# Patient Record
Sex: Female | Born: 1965 | Race: Black or African American | Hispanic: No | Marital: Married | Smoking: Never smoker
Health system: Southern US, Community
[De-identification: ages and names within clinical notes are randomized; demographics above are authoritative.]

## PROBLEM LIST (undated history)

## (undated) DIAGNOSIS — Z803 Family history of malignant neoplasm of breast: Secondary | ICD-10-CM

## (undated) DIAGNOSIS — Z8619 Personal history of other infectious and parasitic diseases: Secondary | ICD-10-CM

## (undated) DIAGNOSIS — R921 Mammographic calcification found on diagnostic imaging of breast: Secondary | ICD-10-CM

## (undated) DIAGNOSIS — D649 Anemia, unspecified: Secondary | ICD-10-CM

## (undated) HISTORY — DX: Family history of malignant neoplasm of breast: Z80.3

## (undated) HISTORY — PX: WISDOM TOOTH EXTRACTION: SHX21

## (undated) HISTORY — DX: Anemia, unspecified: D64.9

## (undated) HISTORY — PX: COLON SURGERY: SHX602

## (undated) HISTORY — DX: Personal history of other infectious and parasitic diseases: Z86.19

## (undated) HISTORY — PX: APPENDECTOMY: SHX54

---
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
## 2005-03-11 ENCOUNTER — Ambulatory Visit: Payer: Self-pay | Admitting: General Practice

## 2005-04-01 ENCOUNTER — Ambulatory Visit: Payer: Self-pay | Admitting: Unknown Physician Specialty

## 2005-04-21 HISTORY — PX: LAPAROSCOPIC SUPRACERVICAL HYSTERECTOMY: SUR797

## 2005-04-21 HISTORY — PX: ABDOMINAL HYSTERECTOMY: SHX81

## 2005-09-29 ENCOUNTER — Ambulatory Visit: Payer: Self-pay | Admitting: General Surgery

## 2006-01-19 ENCOUNTER — Ambulatory Visit: Payer: Self-pay | Admitting: General Surgery

## 2006-03-27 ENCOUNTER — Ambulatory Visit: Payer: Self-pay | Admitting: General Surgery

## 2006-03-28 ENCOUNTER — Ambulatory Visit: Payer: Self-pay | Admitting: General Surgery

## 2006-03-29 ENCOUNTER — Ambulatory Visit: Payer: Self-pay | Admitting: General Surgery

## 2006-03-30 ENCOUNTER — Ambulatory Visit: Payer: Self-pay | Admitting: General Surgery

## 2006-03-31 ENCOUNTER — Ambulatory Visit: Payer: Self-pay | Admitting: General Surgery

## 2006-04-22 ENCOUNTER — Inpatient Hospital Stay: Payer: Self-pay | Admitting: General Surgery

## 2006-07-16 ENCOUNTER — Ambulatory Visit: Payer: Self-pay | Admitting: Orthopedic Surgery

## 2006-07-24 ENCOUNTER — Ambulatory Visit: Payer: Self-pay | Admitting: Orthopedic Surgery

## 2011-11-12 ENCOUNTER — Ambulatory Visit (INDEPENDENT_AMBULATORY_CARE_PROVIDER_SITE_OTHER): Payer: BC Managed Care – PPO | Admitting: Obstetrics and Gynecology

## 2011-11-12 ENCOUNTER — Telehealth: Payer: Self-pay | Admitting: Obstetrics and Gynecology

## 2011-11-12 ENCOUNTER — Encounter: Payer: Self-pay | Admitting: Obstetrics and Gynecology

## 2011-11-12 VITALS — BP 114/72 | HR 70 | Ht 63.75 in | Wt 147.5 lb

## 2011-11-12 DIAGNOSIS — R928 Other abnormal and inconclusive findings on diagnostic imaging of breast: Secondary | ICD-10-CM

## 2011-11-12 DIAGNOSIS — N63 Unspecified lump in unspecified breast: Secondary | ICD-10-CM

## 2011-11-12 DIAGNOSIS — Z124 Encounter for screening for malignant neoplasm of cervix: Secondary | ICD-10-CM

## 2011-11-12 DIAGNOSIS — Z01419 Encounter for gynecological examination (general) (routine) without abnormal findings: Secondary | ICD-10-CM

## 2011-11-12 NOTE — Progress Notes (Signed)
Subjective:    Teresa Potter is a 46 y.o. female, G3P0, who presents for an annual exam. The patient reports "knot" at the top of left breast for the past 2 weeks. States the area is tender but less so today.  Menstrual cycle:   LMP: No LMP recorded. Patient has had a hysterectomy.             Review of Systems Pertinent items are noted in HPI. Denies pelvic pain, urinary tract symptoms, vaginitis symptoms, irregular bleeding, menopausal symptoms, change in bowel habits or rectal bleeding   Objective:    Ht 5' 3.75" (1.619 m)  Wt 147 lb 8 oz (66.906 kg)  BMI 25.52 kg/m2 Wt Readings from Last 1 Encounters:  11/12/11 147 lb 8 oz (66.906 kg)   Body mass index is 25.52 kg/(m^2). General Appearance: Alert, no acute distress HEENT: Grossly normal Neck / Thyroid: Supple, no thyromegaly or cervical adenopathy Lungs: Clear to auscultation bilaterally Back: No CVA tenderness Breast Exam: Right breast normal, left breast-upper outer quadrant, palpable area of thickening that is mildly tender measuring 4 cm x  6 cm, .No dimpling, nipple retraction or discharge. Cardiovascular: Regular rate and rhythm.  Gastrointestinal: Soft, non-tender, no masses or organomegaly Pelvic Exam: EGBUS-wnl, vagina-normal rugae, cervix- without lesions or tenderness, uterus surgically absent,  adnexae-no masses or tenderness Rectovaginal: small hemorrhoid and normal sphincter tone Lymphatic Exam: Non-palpable nodes in neck, clavicular,  axillary, or inguinal regions  Skin: no rashes or abnormalities Extremities: no clubbing cyanosis or edema  Neurologic: grossly normal Psychiatric: Alert and oriented   Assessment:   Routine GYN Exam S/P Total Abdominal Supra-cervical Hysterectomy Left Breast thickening-tender   Plan:  PAP sent  [facility] for diagnostic left breast mammogram with ultrasound if needed for a left upper outer quadrant thickening with tenderness.  Decrease caffeine   intake  RTO 1 year or prn  Torina Ey,ELMIRAPA-C

## 2011-11-12 NOTE — Progress Notes (Signed)
Regular Periods: no Mammogram: yes  Monthly Breast Ex.: yes Exercise: yes  Tetanus < 10 years: no Seatbelts: yes  NI. Bladder Functn.: yes Abuse at home: no  Daily BM's: no Stressful Work: no  Healthy Diet: yes Sigmoid-Colonoscopy: OVER 5 YEARS  Calcium: no Medical problems this year: BREAST PROBLEM   LAST PAP:4/11 NL  Contraception: BTL AND HYST  Mammogram:  4/13  PCP: NO  PMH: NO CHANGE  FMH: NO CHANGE  Last Bone Scan: NO

## 2011-11-12 NOTE — Addendum Note (Signed)
Addended byWinfred Leeds on: 11/12/2011 04:11 PM   Modules accepted: Orders

## 2011-11-13 LAB — PAP IG W/ RFLX HPV ASCU

## 2013-01-20 ENCOUNTER — Other Ambulatory Visit (HOSPITAL_BASED_OUTPATIENT_CLINIC_OR_DEPARTMENT_OTHER): Payer: Self-pay | Admitting: *Deleted

## 2013-01-20 DIAGNOSIS — Z1231 Encounter for screening mammogram for malignant neoplasm of breast: Secondary | ICD-10-CM

## 2013-01-21 ENCOUNTER — Other Ambulatory Visit: Payer: Self-pay | Admitting: Obstetrics and Gynecology

## 2013-01-21 ENCOUNTER — Ambulatory Visit (HOSPITAL_BASED_OUTPATIENT_CLINIC_OR_DEPARTMENT_OTHER)
Admission: RE | Admit: 2013-01-21 | Discharge: 2013-01-21 | Disposition: A | Discharge status: Home / Self Care | Payer: 59 | Source: Ambulatory Visit | Attending: Unknown Physician Specialty | Admitting: Unknown Physician Specialty

## 2013-01-21 DIAGNOSIS — Z1231 Encounter for screening mammogram for malignant neoplasm of breast: Secondary | ICD-10-CM | POA: Insufficient documentation

## 2013-02-03 ENCOUNTER — Other Ambulatory Visit: Payer: Self-pay | Admitting: Obstetrics and Gynecology

## 2013-11-22 ENCOUNTER — Other Ambulatory Visit: Payer: Self-pay | Admitting: Obstetrics and Gynecology

## 2013-11-22 DIAGNOSIS — N6459 Other signs and symptoms in breast: Secondary | ICD-10-CM

## 2013-11-22 DIAGNOSIS — N644 Mastodynia: Secondary | ICD-10-CM

## 2013-11-25 ENCOUNTER — Ambulatory Visit
Admission: RE | Admit: 2013-11-25 | Discharge: 2013-11-25 | Disposition: A | Discharge status: Home / Self Care | Payer: 59 | Source: Ambulatory Visit | Attending: Obstetrics and Gynecology | Admitting: Obstetrics and Gynecology

## 2013-11-25 ENCOUNTER — Encounter (INDEPENDENT_AMBULATORY_CARE_PROVIDER_SITE_OTHER): Payer: Self-pay

## 2013-11-25 DIAGNOSIS — N6459 Other signs and symptoms in breast: Secondary | ICD-10-CM

## 2013-11-25 DIAGNOSIS — N644 Mastodynia: Secondary | ICD-10-CM

## 2014-01-30 ENCOUNTER — Other Ambulatory Visit (HOSPITAL_BASED_OUTPATIENT_CLINIC_OR_DEPARTMENT_OTHER): Payer: Self-pay | Admitting: Obstetrics and Gynecology

## 2014-01-30 DIAGNOSIS — Z1231 Encounter for screening mammogram for malignant neoplasm of breast: Secondary | ICD-10-CM

## 2014-01-31 ENCOUNTER — Ambulatory Visit (HOSPITAL_BASED_OUTPATIENT_CLINIC_OR_DEPARTMENT_OTHER)
Admission: RE | Admit: 2014-01-31 | Discharge: 2014-01-31 | Disposition: A | Discharge status: Home / Self Care | Payer: 59 | Source: Ambulatory Visit | Attending: Obstetrics and Gynecology | Admitting: Obstetrics and Gynecology

## 2014-01-31 DIAGNOSIS — Z1231 Encounter for screening mammogram for malignant neoplasm of breast: Secondary | ICD-10-CM | POA: Diagnosis not present

## 2014-09-04 ENCOUNTER — Emergency Department: Payer: 59

## 2014-09-04 ENCOUNTER — Encounter: Payer: Self-pay | Admitting: Emergency Medicine

## 2014-09-04 ENCOUNTER — Emergency Department
Admission: EM | Admit: 2014-09-04 | Discharge: 2014-09-04 | Disposition: A | Discharge status: Home / Self Care | Payer: 59 | Attending: Emergency Medicine | Admitting: Emergency Medicine

## 2014-09-04 ENCOUNTER — Other Ambulatory Visit: Payer: Self-pay

## 2014-09-04 DIAGNOSIS — Z9049 Acquired absence of other specified parts of digestive tract: Secondary | ICD-10-CM | POA: Diagnosis not present

## 2014-09-04 DIAGNOSIS — R109 Unspecified abdominal pain: Secondary | ICD-10-CM | POA: Diagnosis present

## 2014-09-04 DIAGNOSIS — Z90710 Acquired absence of both cervix and uterus: Secondary | ICD-10-CM | POA: Insufficient documentation

## 2014-09-04 DIAGNOSIS — Z9889 Other specified postprocedural states: Secondary | ICD-10-CM | POA: Insufficient documentation

## 2014-09-04 DIAGNOSIS — N390 Urinary tract infection, site not specified: Secondary | ICD-10-CM | POA: Diagnosis not present

## 2014-09-04 LAB — CBC WITH DIFFERENTIAL/PLATELET
BASOS PCT: 1 %
Basophils Absolute: 0 10*3/uL (ref 0–0.1)
EOS ABS: 0 10*3/uL (ref 0–0.7)
Eosinophils Relative: 1 %
HEMATOCRIT: 41.3 % (ref 35.0–47.0)
Hemoglobin: 13.6 g/dL (ref 12.0–16.0)
Lymphocytes Relative: 29 %
Lymphs Abs: 1.8 10*3/uL (ref 1.0–3.6)
MCH: 30.6 pg (ref 26.0–34.0)
MCHC: 32.9 g/dL (ref 32.0–36.0)
MCV: 93.1 fL (ref 80.0–100.0)
MONO ABS: 0.4 10*3/uL (ref 0.2–0.9)
Monocytes Relative: 7 %
Neutro Abs: 4 10*3/uL (ref 1.4–6.5)
Neutrophils Relative %: 62 %
Platelets: 307 10*3/uL (ref 150–440)
RBC: 4.44 MIL/uL (ref 3.80–5.20)
RDW: 12.6 % (ref 11.5–14.5)
WBC: 6.3 10*3/uL (ref 3.6–11.0)

## 2014-09-04 LAB — COMPREHENSIVE METABOLIC PANEL
ALT: 13 U/L — AB (ref 14–54)
AST: 15 U/L (ref 15–41)
Albumin: 4.1 g/dL (ref 3.5–5.0)
Alkaline Phosphatase: 32 U/L — ABNORMAL LOW (ref 38–126)
Anion gap: 4 — ABNORMAL LOW (ref 5–15)
BILIRUBIN TOTAL: 1.3 mg/dL — AB (ref 0.3–1.2)
BUN: 12 mg/dL (ref 6–20)
CALCIUM: 8.9 mg/dL (ref 8.9–10.3)
CHLORIDE: 105 mmol/L (ref 101–111)
CO2: 30 mmol/L (ref 22–32)
Creatinine, Ser: 0.68 mg/dL (ref 0.44–1.00)
GFR calc Af Amer: >60 mL/min (ref >60)
Glucose, Bld: 89 mg/dL (ref 65–99)
Potassium: 4.3 mmol/L (ref 3.5–5.1)
SODIUM: 139 mmol/L (ref 135–145)
Total Protein: 7 g/dL (ref 6.5–8.1)

## 2014-09-04 LAB — URINALYSIS COMPLETE WITH MICROSCOPIC (ARMC ONLY)
Bacteria, UA: NONE SEEN
Bilirubin Urine: NEGATIVE
Glucose, UA: NEGATIVE mg/dL
Hgb urine dipstick: NEGATIVE
LEUKOCYTES UA: NEGATIVE
NITRITE: NEGATIVE
PROTEIN: 30 mg/dL — AB
SPECIFIC GRAVITY, URINE: 1.035 — AB (ref 1.005–1.030)
pH: 5 (ref 5.0–8.0)

## 2014-09-04 MED ORDER — CIPROFLOXACIN HCL 500 MG PO TABS: 500.0000 mg | ORAL_TABLET | Freq: Two times a day (BID) | ORAL | Status: AC

## 2014-09-04 MED ORDER — CIPROFLOXACIN HCL 500 MG PO TABS
500.0000 mg | ORAL_TABLET | Freq: Once | ORAL | Status: AC
  Administered 2014-09-04: 500 mg via ORAL

## 2014-09-04 MED ORDER — CIPROFLOXACIN HCL 500 MG PO TABS
ORAL_TABLET | ORAL | Status: AC
  Administered 2014-09-04: 500 mg via ORAL
  Filled 2014-09-04: qty 1

## 2014-09-04 NOTE — Discharge Instructions (Signed)

## 2014-09-04 NOTE — ED Notes (Signed)
Patient transported to X-ray 

## 2014-09-04 NOTE — ED Notes (Signed)
Pt to ed with c/o abdominal pain that started about 2 weeks ago.  Pt states she thought it was gas.  Denies N/V/D.  Pt does report fatigue

## 2014-09-04 NOTE — ED Provider Notes (Signed)
[facility] Emergency Department Provider Note  Time seen: 5:26 PM  I have reviewed the triage vital signs and the nursing notes.   HISTORY  Chief Complaint Abdominal Pain    HPI Teresa Potter is a 49 y.o. female with no past medical history who presents the emergency department with vague abdominal pain 2 weeks. According to the patient she thought this might be due to gas or indigestion, but has continued intermittently for 2 weeks. Patient cannot pinpoint an exact location but states it is intermittent and moderate in severity at its maximum. Denies any association with food. Denies nausea, vomiting, diarrhea, fever, dysuria, hematuria, vaginal bleeding or discharge. Patient had a hysterectomy years ago.    Past Medical History  Diagnosis Date  . Anemia   . H/O varicella     There are no active problems to display for this patient.   Past Surgical History  Procedure Laterality Date  . Wisdom tooth extraction    . Appendectomy    . Colon surgery      to decrease length of colon  . Abdominal hysterectomy  2007    SUPRA-CERVICAL    No current outpatient prescriptions on file.  Allergies Review of patient's allergies indicates no known allergies.  Family History  Problem Relation Age of Onset  . Heart disease Paternal Uncle   . Heart disease Paternal Uncle   . Heart disease Paternal Uncle   . Heart disease Paternal Uncle   . Cancer Maternal Aunt     COLON  . Cancer Maternal Uncle     COLON  . Hypertension Mother     Social History History  Substance Use Topics  . Smoking status: Never Smoker   . Smokeless tobacco: Never Used  . Alcohol Use: Yes     Comment: OCCASIONAL    Review of Systems Constitutional: Negative for fever. Cardiovascular: Negative for chest pain. Respiratory: Negative for shortness of breath. Gastrointestinal: Positive for mid abdominal pain. Negative for nausea/vomiting/diarrhea Genitourinary: Negative  for dysuria. Musculoskeletal: Negative for back pain.  10-point ROS otherwise negative.  ____________________________________________   PHYSICAL EXAM:  VITAL SIGNS: ED Triage Vitals  Enc Vitals Group     BP 09/04/14 1438 101/67 mmHg     Pulse --      Resp 09/04/14 1438 18     Temp 09/04/14 1438 98.2 F (36.8 C)     Temp Source 09/04/14 1438 Oral     SpO2 09/04/14 1438 100 %     Weight 09/04/14 1438 129 lb (58.514 kg)     Height 09/04/14 1438 5\' 2"  (1.575 m)     Head Cir --      Peak Flow --      Pain Score 09/04/14 1439 5     Pain Loc --      Pain Edu? --      Excl. in GC? --     Constitutional: Alert and oriented. Well appearing and in no distress. ENT   Mouth/Throat: Mucous membranes are moist. Cardiovascular: Normal rate, regular rhythm. No murmurs, rubs, or gallops. Respiratory: Normal respiratory effort without tachypnea nor retractions. Breath sounds are clear and equal bilaterally. No wheezes/rales/rhonchi. Gastrointestinal: Soft and nontender. No distention.  There is no CVA tenderness. Musculoskeletal: Nontender with normal range of motion in all extremities.  Neurologic:  Normal speech and language. No gross focal neurologic deficits  Skin:  Skin is warm, dry and intact.  Psychiatric: Mood and affect are normal. Speech and behavior  are normal.   ____________________________________________    EKG  EKG shows normal sinus rhythm at 60 bpm, narrow QRS, normal axis, normal intervals, no concerning ST changes noted.  ____________________________________________    RADIOLOGY CT abdomen and pelvis within normal limits.  ____________________________________________    INITIAL IMPRESSION / ASSESSMENT AND PLAN / ED COURSE  Pertinent labs & imaging results that were available during my care of the patient were reviewed by me and considered in my medical decision making (see chart for details).  Vague abdominal pain, patient cannot pinpoint exactly  location. Urinalysis shows 6-30 red blood cells and patient is status post hysterectomy. We will obtain CT scan to evaluate for ureteral lithiasis. I will also send a urine culture to rule out urinary tract infection.  ----------------------------------------- 6:53 PM on 09/04/2014 -----------------------------------------  CT within normal limits. Given red blood cells in the urine and a normal CT will cover as a urinary tract infection until her culture grows out. Patient to follow up with her primary care physician.  ____________________________________________   FINAL CLINICAL IMPRESSION(S) / ED DIAGNOSES  Abdominal pain Urinary tract infection   Minna AntisKevin Fynlee Rowlands, MD 09/04/14 [id number]

## 2014-09-07 LAB — URINE CULTURE: Culture: 7000

## 2015-01-17 ENCOUNTER — Other Ambulatory Visit (HOSPITAL_BASED_OUTPATIENT_CLINIC_OR_DEPARTMENT_OTHER): Payer: Self-pay | Admitting: Obstetrics and Gynecology

## 2015-01-17 DIAGNOSIS — Z1231 Encounter for screening mammogram for malignant neoplasm of breast: Secondary | ICD-10-CM

## 2015-01-22 ENCOUNTER — Other Ambulatory Visit (HOSPITAL_BASED_OUTPATIENT_CLINIC_OR_DEPARTMENT_OTHER): Payer: Self-pay | Admitting: Family Medicine

## 2015-01-22 DIAGNOSIS — Z1231 Encounter for screening mammogram for malignant neoplasm of breast: Secondary | ICD-10-CM

## 2015-01-23 ENCOUNTER — Ambulatory Visit (HOSPITAL_BASED_OUTPATIENT_CLINIC_OR_DEPARTMENT_OTHER)
Admission: RE | Admit: 2015-01-23 | Discharge: 2015-01-23 | Disposition: A | Discharge status: Home / Self Care | Payer: 59 | Source: Ambulatory Visit | Attending: Obstetrics and Gynecology | Admitting: Obstetrics and Gynecology

## 2015-01-23 DIAGNOSIS — Z1231 Encounter for screening mammogram for malignant neoplasm of breast: Secondary | ICD-10-CM | POA: Diagnosis not present

## 2015-06-13 ENCOUNTER — Emergency Department: Payer: 59

## 2015-06-13 ENCOUNTER — Emergency Department
Admission: EM | Admit: 2015-06-13 | Discharge: 2015-06-13 | Disposition: A | Discharge status: Home / Self Care | Payer: 59 | Attending: Emergency Medicine | Admitting: Emergency Medicine

## 2015-06-13 DIAGNOSIS — K5900 Constipation, unspecified: Secondary | ICD-10-CM | POA: Diagnosis not present

## 2015-06-13 DIAGNOSIS — R109 Unspecified abdominal pain: Secondary | ICD-10-CM

## 2015-06-13 DIAGNOSIS — R1032 Left lower quadrant pain: Secondary | ICD-10-CM | POA: Diagnosis present

## 2015-06-13 LAB — CBC
HEMATOCRIT: 40 % (ref 35.0–47.0)
Hemoglobin: 13.4 g/dL (ref 12.0–16.0)
MCH: 30.4 pg (ref 26.0–34.0)
MCHC: 33.6 g/dL (ref 32.0–36.0)
MCV: 90.4 fL (ref 80.0–100.0)
Platelets: 292 10*3/uL (ref 150–440)
RBC: 4.43 MIL/uL (ref 3.80–5.20)
RDW: 12.5 % (ref 11.5–14.5)
WBC: 4.9 10*3/uL (ref 3.6–11.0)

## 2015-06-13 LAB — URINALYSIS COMPLETE WITH MICROSCOPIC (ARMC ONLY)
BILIRUBIN URINE: NEGATIVE
GLUCOSE, UA: NEGATIVE mg/dL
KETONES UR: NEGATIVE mg/dL
NITRITE: NEGATIVE
Protein, ur: NEGATIVE mg/dL
Specific Gravity, Urine: 1.005 (ref 1.005–1.030)
pH: 7 (ref 5.0–8.0)

## 2015-06-13 LAB — COMPREHENSIVE METABOLIC PANEL
ALK PHOS: 48 U/L (ref 38–126)
ALT: 25 U/L (ref 14–54)
ANION GAP: 6 (ref 5–15)
AST: 22 U/L (ref 15–41)
Albumin: 4.3 g/dL (ref 3.5–5.0)
BILIRUBIN TOTAL: 1.4 mg/dL — AB (ref 0.3–1.2)
BUN: 9 mg/dL (ref 6–20)
CALCIUM: 9.1 mg/dL (ref 8.9–10.3)
CO2: 28 mmol/L (ref 22–32)
Chloride: 105 mmol/L (ref 101–111)
Creatinine, Ser: 0.69 mg/dL (ref 0.44–1.00)
GFR calc non Af Amer: >60 mL/min (ref >60)
Glucose, Bld: 90 mg/dL (ref 65–99)
POTASSIUM: 3.7 mmol/L (ref 3.5–5.1)
SODIUM: 139 mmol/L (ref 135–145)
TOTAL PROTEIN: 7.6 g/dL (ref 6.5–8.1)

## 2015-06-13 LAB — LIPASE, BLOOD: Lipase: 25 U/L (ref 11–51)

## 2015-06-13 MED ORDER — PEG 3350-KCL-NA BICARB-NACL 420 G PO SOLR: 4000.0000 mL | Freq: Once | ORAL | Status: DC

## 2015-06-13 MED ORDER — OXYCODONE-ACETAMINOPHEN 5-325 MG PO TABS
2.0000 | ORAL_TABLET | Freq: Once | ORAL | Status: AC
  Administered 2015-06-13: 2 via ORAL
  Filled 2015-06-13: qty 2

## 2015-06-13 NOTE — ED Notes (Signed)
Patient transported to CT 

## 2015-06-13 NOTE — Discharge Instructions (Signed)
Flank Pain °Flank pain refers to pain that is located on the side of the body between the upper abdomen and the back. The pain may occur over a short period of time (acute) or may be long-term or reoccurring (chronic). It may be mild or severe. Flank pain can be caused by many things. °CAUSES  °Some of the more common causes of flank pain include: °· Muscle strains.   °· Muscle spasms.   °· A disease of your spine (vertebral disk disease).   °· A lung infection (pneumonia).   °· Fluid around your lungs (pulmonary edema).   °· A kidney infection.   °· Kidney stones.   °· A very painful skin rash caused by the chickenpox virus (shingles).   °· Gallbladder disease.   °HOME CARE INSTRUCTIONS  °Home care will depend on the cause of your pain. In general, °· Rest as directed by your caregiver. °· Drink enough fluids to keep your urine clear or pale yellow. °· Only take over-the-counter or prescription medicines as directed by your caregiver. Some medicines may help relieve the pain. °· Tell your caregiver about any changes in your pain. °· Follow up with your caregiver as directed. °SEEK IMMEDIATE MEDICAL CARE IF:  °· Your pain is not controlled with medicine.   °· You have new or worsening symptoms. °· Your pain increases.   °· You have abdominal pain.   °· You have shortness of breath.   °· You have persistent nausea or vomiting.   °· You have swelling in your abdomen.   °· You feel faint or pass out.   °· You have blood in your urine. °· You have a fever or persistent symptoms for more than 2-3 days. °· You have a fever and your symptoms suddenly get worse. °MAKE SURE YOU:  °· Understand these instructions. °· Will watch your condition. °· Will get help right away if you are not doing well or get worse. °  °This information is not intended to replace advice given to you by your health care provider. Make sure you discuss any questions you have with your health care provider. °  °Document Released: 05/29/2005 Document  Revised: 12/31/2011 Document Reviewed: 11/20/2011 °Elsevier Interactive Patient Education ©2016 [employer] °Constipation, Adult °Constipation is when a person has fewer than three bowel movements a week, has difficulty having a bowel movement, or has stools that are dry, hard, or larger than normal. As people grow older, constipation is more common. A low-fiber diet, not taking in enough fluids, and taking certain medicines may make constipation worse.  °CAUSES  °· Certain medicines, such as antidepressants, pain medicine, iron supplements, antacids, and water pills.   °· Certain diseases, such as diabetes, irritable bowel syndrome (IBS), thyroid disease, or depression.   °· Not drinking enough water.   °· Not eating enough fiber-rich foods.   °· Stress or travel.   °· Lack of physical activity or exercise.   °· Ignoring the urge to have a bowel movement.   °· Using laxatives too much.   °SIGNS AND SYMPTOMS  °· Having fewer than three bowel movements a week.   °· Straining to have a bowel movement.   °· Having stools that are hard, dry, or larger than normal.   °· Feeling full or bloated.   °· Pain in the lower abdomen.   °· Not feeling relief after having a bowel movement.   °DIAGNOSIS  °Your health care provider will take a medical history and perform a physical exam. Further testing may be done for severe constipation. Some tests may include: °· A barium enema X-ray to examine your rectum, colon, and, sometimes, your small   intestine.   °· A sigmoidoscopy to examine your lower colon.   °· A colonoscopy to examine your entire colon. °TREATMENT  °Treatment will depend on the severity of your constipation and what is causing it. Some dietary treatments include drinking more fluids and eating more fiber-rich foods. Lifestyle treatments may include regular exercise. If these diet and lifestyle recommendations do not help, your health care provider may recommend taking over-the-counter laxative medicines to help you  have bowel movements. Prescription medicines may be prescribed if over-the-counter medicines do not work.  °HOME CARE INSTRUCTIONS  °· Eat foods that have a lot of fiber, such as fruits, vegetables, whole grains, and beans. °· Limit foods high in fat and processed sugars, such as french fries, hamburgers, cookies, candies, and soda.   °· A fiber supplement may be added to your diet if you cannot get enough fiber from foods.   °· Drink enough fluids to keep your urine clear or pale yellow.   °· Exercise regularly or as directed by your health care provider.   °· Go to the restroom when you have the urge to go. Do not hold it.   °· Only take over-the-counter or prescription medicines as directed by your health care provider. Do not take other medicines for constipation without talking to your health care provider first.   °SEEK IMMEDIATE MEDICAL CARE IF:  °· You have bright red blood in your stool.   °· Your constipation lasts for more than 4 days or gets worse.   °· You have abdominal or rectal pain.   °· You have thin, pencil-like stools.   °· You have unexplained weight loss. °MAKE SURE YOU:  °· Understand these instructions. °· Will watch your condition. °· Will get help right away if you are not doing well or get worse. °  °This information is not intended to replace advice given to you by your health care provider. Make sure you discuss any questions you have with your health care provider. °  °Document Released: 01/04/2004 Document Revised: 04/28/2014 Document Reviewed: 01/17/2013 °Elsevier Interactive Patient Education ©2016 [employer] Inc[employer] ° °

## 2015-06-13 NOTE — ED Provider Notes (Signed)
[facility] Emergency Department Provider Note     Time seen: ----------------------------------------- 12:06 PM on 06/13/2015 -----------------------------------------    I have reviewed the triage vital signs and the nursing notes.   HISTORY  Chief Complaint Abdominal Pain    HPI Teresa Potter is a 50 y.o. female who presents to ER for left lower quadrant pain since Friday. She was seen by her doctor on Monday was diagnosed with diverticulitis and placed on Cipro and Flagyl. Patient reports also being constipated, and that she was diagnosed with UTI as well. Patient does not feel like her symptoms or any better, still complaining of left lower quadrant pain. Nothing helps it.   Past Medical History  Diagnosis Date  . Anemia   . H/O varicella     There are no active problems to display for this patient.   Past Surgical History  Procedure Laterality Date  . Wisdom tooth extraction    . Appendectomy    . Colon surgery      to decrease length of colon  . Abdominal hysterectomy  2007    SUPRA-CERVICAL    Allergies Review of patient's allergies indicates no known allergies.  Social History Social History  Substance Use Topics  . Smoking status: Never Smoker   . Smokeless tobacco: Never Used  . Alcohol Use: Yes     Comment: OCCASIONAL    Review of Systems Constitutional: Negative for fever. Eyes: Negative for visual changes. ENT: Negative for sore throat. Cardiovascular: Negative for chest pain. Respiratory: Negative for shortness of breath. Gastrointestinal: Positive for abdominal pain, constipation Genitourinary: Negative for dysuria. Musculoskeletal: Negative for back pain. Skin: Negative for rash. Neurological: Negative for headaches, focal weakness or numbness.  10-point ROS otherwise negative.  ____________________________________________   PHYSICAL EXAM:  VITAL SIGNS: ED Triage Vitals  Enc Vitals Group     BP  06/13/15 1053 114/71 mmHg     Pulse Rate 06/13/15 1053 65     Resp 06/13/15 1053 18     Temp 06/13/15 1053 97.8 F (36.6 C)     Temp Source 06/13/15 1053 Oral     SpO2 06/13/15 1053 100 %     Weight 06/13/15 1053 131 lb (59.421 kg)     Height 06/13/15 1053  (1.575 m)     Head Cir --      Peak Flow --      Pain Score 06/13/15 1053 6     Pain Loc --      Pain Edu? --      Excl. in GC? --     Constitutional: Alert and oriented. Well appearing and in no distress. Eyes: Conjunctivae are normal. PERRL. Normal extraocular movements. ENT   Head: Normocephalic and atraumatic.   Nose: No congestion/rhinnorhea.   Mouth/Throat: Mucous membranes are moist.   Neck: No stridor. Cardiovascular: Normal rate, regular rhythm. Normal and symmetric distal pulses are present in all extremities. No murmurs, rubs, or gallops. Respiratory: Normal respiratory effort without tachypnea nor retractions. Breath sounds are clear and equal bilaterally. No wheezes/rales/rhonchi. Gastrointestinal: Left lower quadrant tenderness, no rebound or guarding. Musculoskeletal: Nontender with normal range of motion in all extremities. No joint effusions.  No lower extremity tenderness nor edema. Neurologic:  Normal speech and language. No gross focal neurologic deficits are appreciated. Speech is normal. No gait instability. Skin:  Skin is warm, dry and intact. No rash noted. Psychiatric: Mood and affect are normal. Speech and behavior are normal. Patient exhibits appropriate insight  and judgment. ____________________________________________  ED COURSE:  Pertinent labs & imaging results that were available during my care of the patient were reviewed by me and considered in my medical decision making (see chart for details). Patient is in no acute distress, will obtain basic labs and a clean imaging. ____________________________________________    LABS (pertinent positives/negatives)  Labs Reviewed   COMPREHENSIVE METABOLIC PANEL - Abnormal; Notable for the following:    Total Bilirubin 1.4 (*)    All other components within normal limits  URINALYSIS COMPLETEWITH MICROSCOPIC ([facility] ONLY) - Abnormal; Notable for the following:    Color, Urine YELLOW (*)    APPearance CLOUDY (*)    Hgb urine dipstick 1+ (*)    Leukocytes, UA 3+ (*)    Bacteria, UA RARE (*)    Squamous Epithelial / LPF TOO NUMEROUS TO COUNT (*)    All other components within normal limits  LIPASE, BLOOD  CBC    RADIOLOGY Images were viewed by me  CT renal protocol IMPRESSION: No acute finding. No evidence of urinary tract stone disease. No other cause of left-sided pain identified. Previous anastomosis in the sigmoid colon without evidence of complication. ____________________________________________  FINAL ASSESSMENT AND PLAN  Flank pain, constipation  Plan: Patient with labs and imaging as dictated above. Flank pain likely secondary to constipation. I will advise continuing on the Cipro but stopping the Flagyl. I will also prescribe TriLyte for her constipation. She is stable for outpatient follow-up   Emily Filbert, MD   Emily Filbert, MD 06/13/15 1254

## 2015-06-13 NOTE — ED Notes (Signed)
Pt c/o LLQ pain since Friday was seen by PCP on Monday and dx with diverticulitis and Rx flagyl and cipro, states she also been constipated and tried mirilax and mag citrate without any relief.. Denies vomiting.Marland Kitchen

## 2016-02-20 IMAGING — MG MM DIGITAL SCREENING BILAT W/ CAD
5 series · 5 of 5 positions shown · non-contrast
Comparison: Previous exam(s).

CLINICAL DATA: Screening.

EXAM:
DIGITAL SCREENING BILATERAL MAMMOGRAM WITH CAD

[L MLO (1 of 2)]
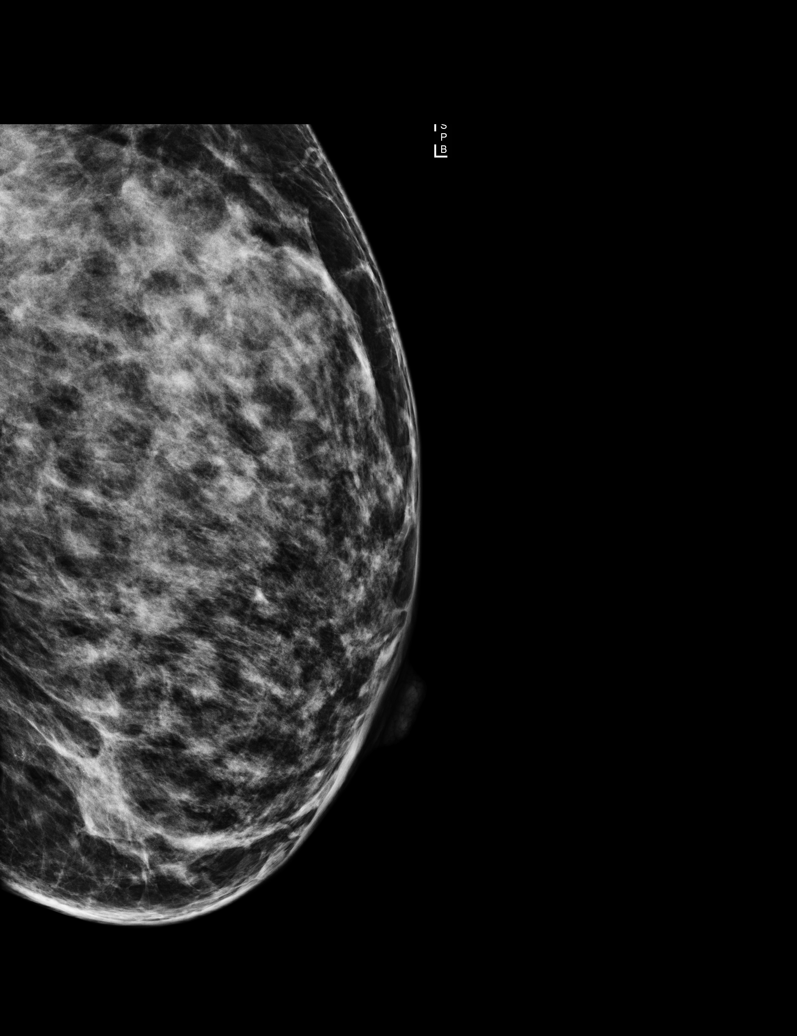

[R MLO]
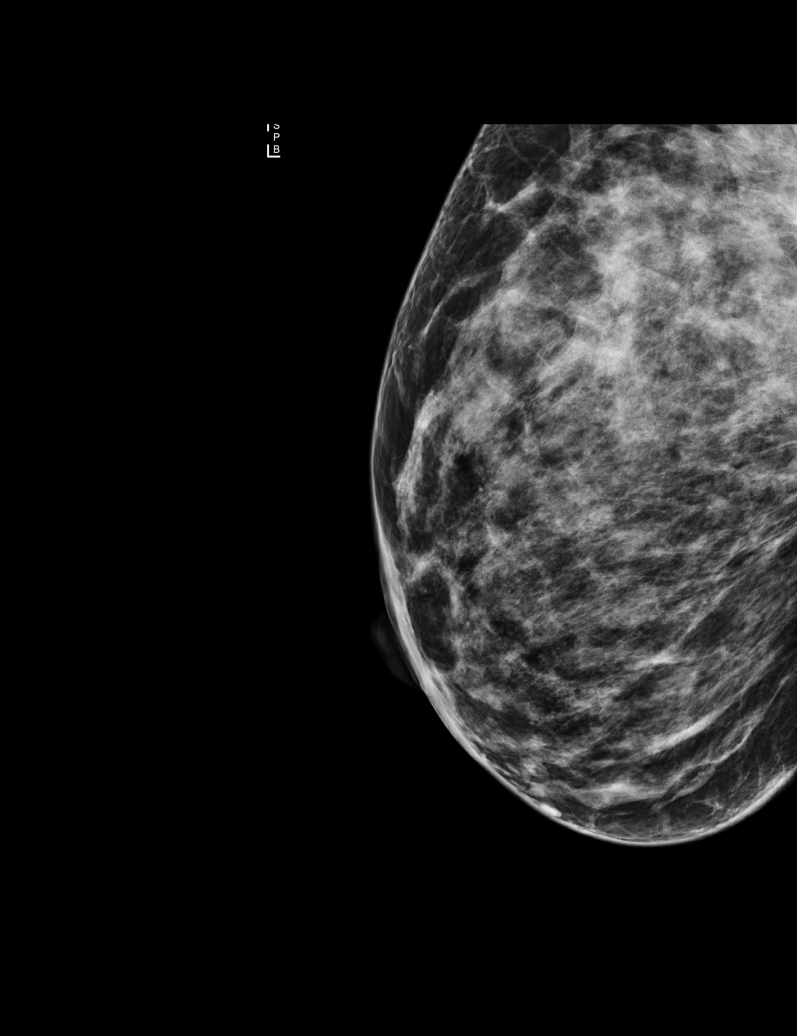

[L MLO (2 of 2)]
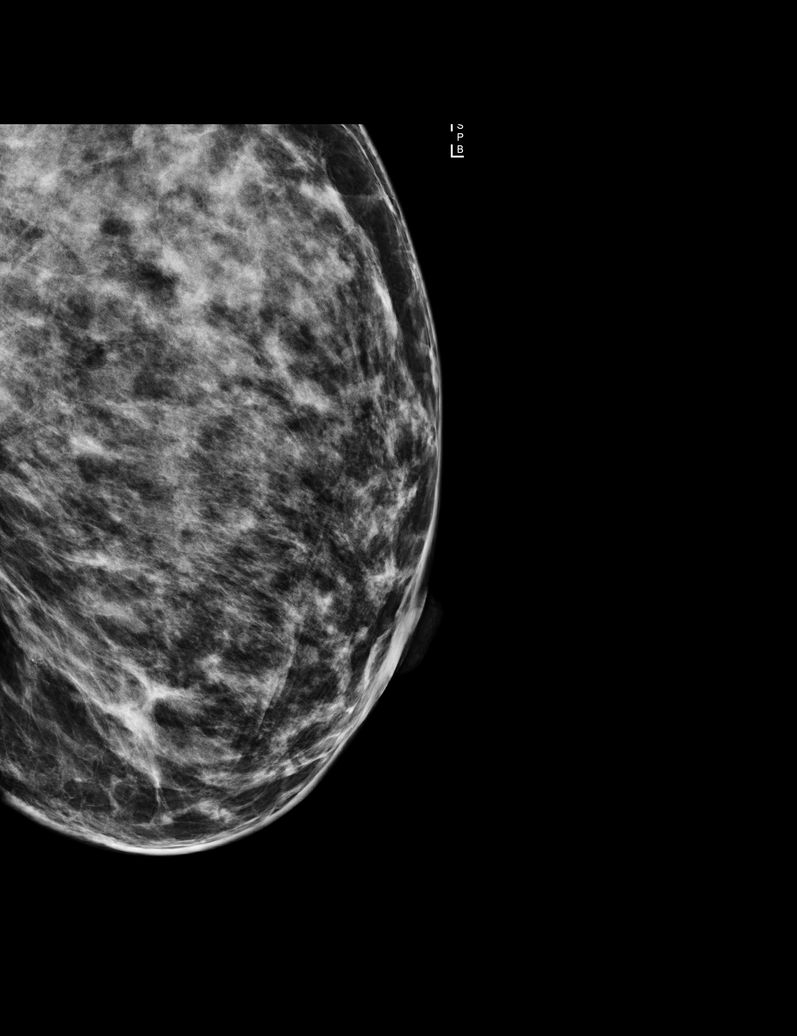

[R CC]
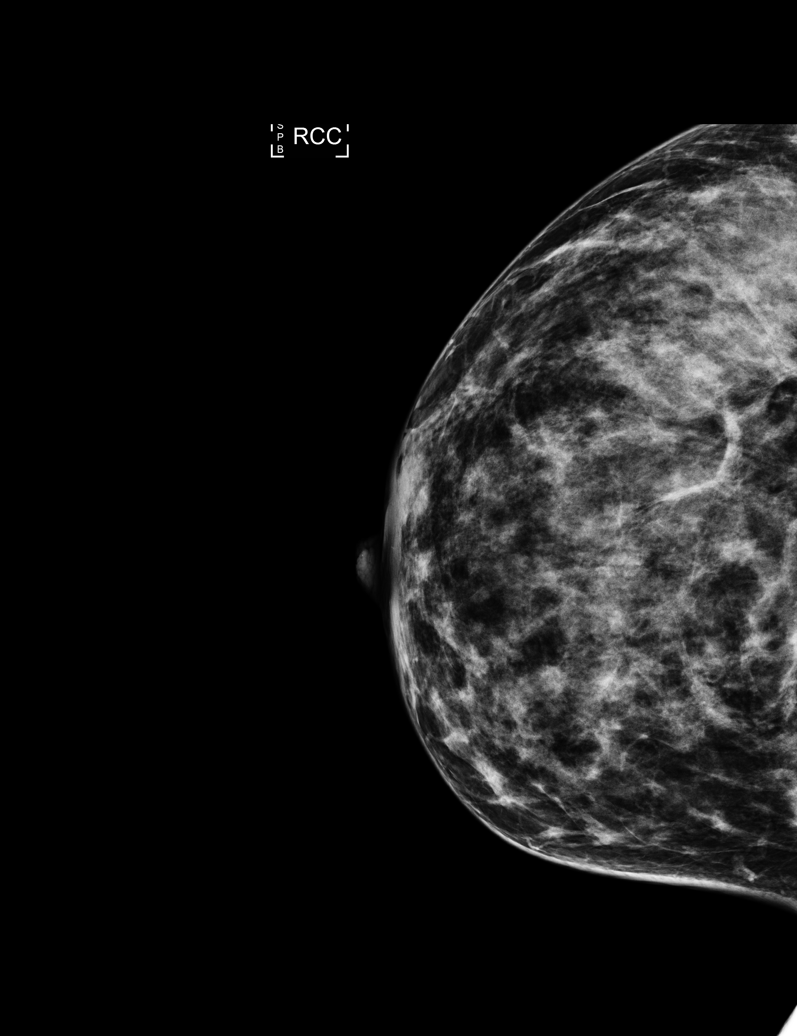

[L CC]
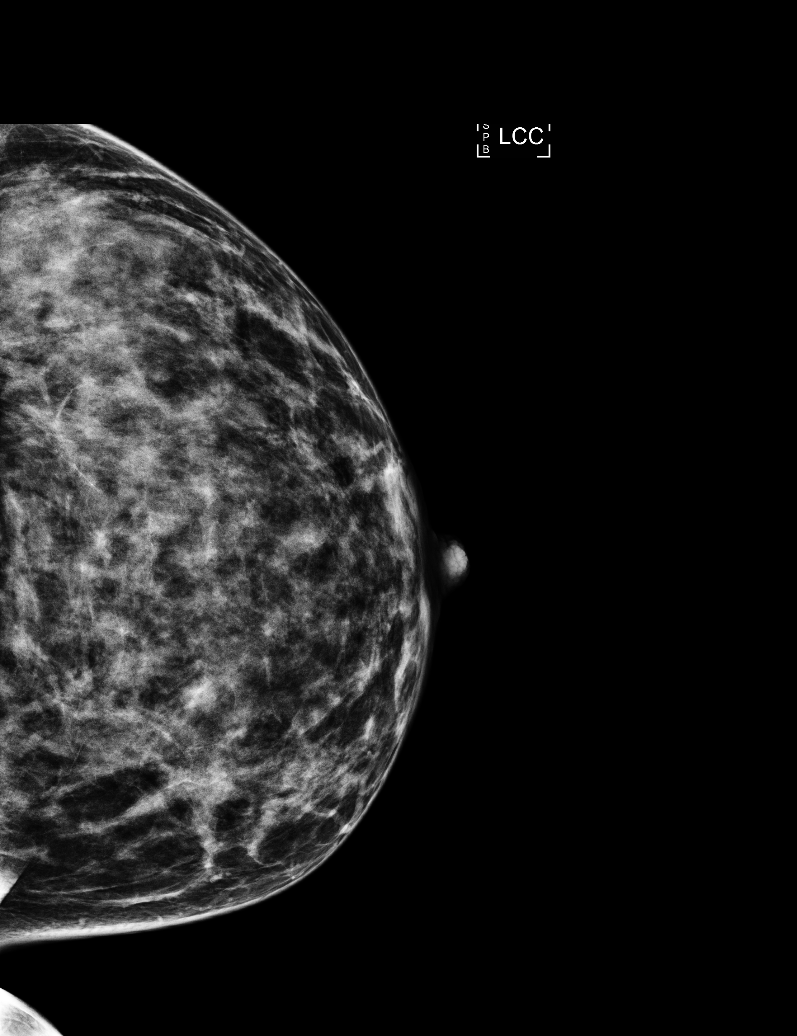

[5 of 5 positions shown; findings below may reference images not displayed]

ACR Breast Density Category c: The breast tissue is heterogeneously
dense, which may obscure small masses.
FINDINGS: There are no findings suspicious for malignancy. Images were
processed with CAD.
IMPRESSION: No mammographic evidence of malignancy. A result letter of this
screening mammogram will be mailed directly to the patient.

RECOMMENDATION:
Screening mammogram in one year. (Code:[id number])

BI-RADS CATEGORY  1: Negative.

## 2016-04-21 DIAGNOSIS — R921 Mammographic calcification found on diagnostic imaging of breast: Secondary | ICD-10-CM

## 2016-04-21 HISTORY — PX: COLONOSCOPY: SHX174

## 2016-04-21 HISTORY — PX: BREAST BIOPSY: SHX20

## 2016-04-21 HISTORY — DX: Mammographic calcification found on diagnostic imaging of breast: R92.1

## 2018-05-01 ENCOUNTER — Emergency Department: Payer: 59

## 2018-05-01 ENCOUNTER — Encounter: Payer: Self-pay | Admitting: Emergency Medicine

## 2018-05-01 ENCOUNTER — Other Ambulatory Visit: Payer: Self-pay

## 2018-05-01 DIAGNOSIS — R0789 Other chest pain: Secondary | ICD-10-CM | POA: Diagnosis not present

## 2018-05-01 LAB — CBC
HCT: 39.9 % (ref 36.0–46.0)
Hemoglobin: 13 g/dL (ref 12.0–15.0)
MCH: 30.9 pg (ref 26.0–34.0)
MCHC: 32.6 g/dL (ref 30.0–36.0)
MCV: 94.8 fL (ref 80.0–100.0)
Platelets: 378 10*3/uL (ref 150–400)
RBC: 4.21 MIL/uL (ref 3.87–5.11)
RDW: 12.2 % (ref 11.5–15.5)
WBC: 6.1 10*3/uL (ref 4.0–10.5)
nRBC: 0 % (ref 0.0–0.2)

## 2018-05-01 LAB — BASIC METABOLIC PANEL
Anion gap: 7 (ref 5–15)
BUN: 10 mg/dL (ref 6–20)
CO2: 28 mmol/L (ref 22–32)
Calcium: 9 mg/dL (ref 8.9–10.3)
Chloride: 104 mmol/L (ref 98–111)
Creatinine, Ser: 0.58 mg/dL (ref 0.44–1.00)
GFR calc Af Amer: >60 mL/min (ref >60)
GFR calc non Af Amer: >60 mL/min (ref >60)
Glucose, Bld: 94 mg/dL (ref 70–99)
Potassium: 4 mmol/L (ref 3.5–5.1)
Sodium: 139 mmol/L (ref 135–145)

## 2018-05-01 LAB — TROPONIN I: Troponin I: <0.03 ng/mL (ref <0.03)

## 2018-05-01 NOTE — ED Triage Notes (Signed)
Pt arrives POV and ambulatory to triage with c/o chest pain x 1 week. Pt states that she had a cold and cough for that time but is now feeling pain sub sternally. Pt is in NAD.

## 2018-05-02 ENCOUNTER — Emergency Department
Admission: EM | Admit: 2018-05-02 | Discharge: 2018-05-02 | Disposition: A | Discharge status: Home / Self Care | Payer: 59 | Attending: Emergency Medicine | Admitting: Emergency Medicine

## 2018-05-02 DIAGNOSIS — R0789 Other chest pain: Secondary | ICD-10-CM

## 2018-05-02 MED ORDER — NAPROXEN 500 MG PO TABS: 500.0000 mg | ORAL_TABLET | Freq: Two times a day (BID) | ORAL | 0 refills | Status: AC

## 2018-05-02 MED ORDER — CYCLOBENZAPRINE HCL 10 MG PO TABS: 10.0000 mg | ORAL_TABLET | Freq: Three times a day (TID) | ORAL | 0 refills | Status: AC | PRN

## 2018-05-02 NOTE — Discharge Instructions (Addendum)
Take the Naprosyn twice daily over at least the next several days up to 2 weeks, and the Flexeril if needed for more significant pain especially at night.  Return to the ER for new, worsening, or persistent severe chest pain, difficulty breathing, weakness or lightheadedness, or any other new or worsening symptoms that concern you.

## 2018-05-02 NOTE — ED Provider Notes (Signed)
[facility] Emergency Department Provider Note ____________________________________________   First MD Initiated Contact with Patient 05/02/18 0024     (approximate)  I have reviewed the triage vital signs and the nursing notes.   HISTORY  Chief Complaint Chest Pain    HPI Teresa Potter is a 53 y.o. female with PMH as noted below who presents with chest pain, gradual onset over the last week, initially close to her right armpit and then spreading to the middle of her chest.  She describes it as soreness and sometimes sharp.  Is worse with certain positions and during certain motions such as get a hug.  Is associated with mild shortness of breath but no nausea or lightheadedness.  The patient states that the pain started after she had had a relatively severe cough for a few weeks.  The coughing has now resolved.  She denies any leg pain or swelling.  Past Medical History:  Diagnosis Date  . Anemia   . H/O varicella     There are no active problems to display for this patient.   Past Surgical History:  Procedure Laterality Date  . ABDOMINAL HYSTERECTOMY  2007   SUPRA-CERVICAL  . APPENDECTOMY    . COLON SURGERY     to decrease length of colon  . WISDOM TOOTH EXTRACTION      Prior to Admission medications   Medication Sig Start Date End Date Taking? Authorizing Provider  cyclobenzaprine (FLEXERIL) 10 MG tablet Take 1 tablet (10 mg total) by mouth 3 (three) times daily as needed for up to 5 days for muscle spasms. 05/02/18 05/07/18  Dionne Bucy, MD  naproxen (NAPROSYN) 500 MG tablet Take 1 tablet (500 mg total) by mouth 2 (two) times daily with a meal for 15 days. 05/02/18 05/17/18  Dionne Bucy, MD  polyethylene glycol-electrolytes (TRILYTE) 420 g solution Take 4,000 mLs by mouth once. 06/13/15   Emily Filbert, MD    Allergies Patient has no known allergies.  Family History  Problem Relation Age of Onset  . Heart disease  Paternal Uncle   . Heart disease Paternal Uncle   . Heart disease Paternal Uncle   . Heart disease Paternal Uncle   . Cancer Maternal Aunt        COLON  . Cancer Maternal Uncle        COLON  . Hypertension Mother     Social History Social History   Tobacco Use  . Smoking status: Never Smoker  . Smokeless tobacco: Never Used  Substance Use Topics  . Alcohol use: Yes    Comment: OCCASIONAL  . Drug use: No    Review of Systems  Constitutional: No fever. Eyes: Redness. ENT: No neck pain. Cardiovascular: Positive for chest pain. Respiratory: Positive for mild shortness of breath. Gastrointestinal: No vomiting or diarrhea.  Genitourinary: Negative for flank pain.  Musculoskeletal: Negative for back pain. Skin: Negative for rash. Neurological: Negative for headache.   ____________________________________________   PHYSICAL EXAM:  VITAL SIGNS: ED Triage Vitals  Enc Vitals Group     BP 05/01/18 1958 127/61     Pulse Rate 05/01/18 1958 60     Resp 05/01/18 1958 18     Temp 05/01/18 1958 97.9 F (36.6 C)     Temp Source 05/01/18 1958 Oral     SpO2 05/01/18 1958 100 %     Weight 05/01/18 1958 153 lb (69.4 kg)     Height 05/01/18 1958 5' 2.5" (1.588 m)  Head Circumference --      Peak Flow --      Pain Score 05/01/18 2008 8     Pain Loc --      Pain Edu? --      Excl. in GC? --     Constitutional: Alert and oriented. Well appearing and in no acute distress. Eyes: Conjunctivae are normal.  Head: Atraumatic. Nose: No congestion/rhinnorhea. Mouth/Throat: Mucous membranes are moist.   Neck: Normal range of motion.  Cardiovascular: Normal rate, regular rhythm. Grossly normal heart sounds.  Good peripheral circulation. Respiratory: Normal respiratory effort.  No retractions. Lungs CTAB. Gastrointestinal: No distention.  Musculoskeletal: No lower extremity edema.  Extremities warm and well perfused.  No calf or popliteal swelling or tenderness. Neurologic:   Normal speech and language. No gross focal neurologic deficits are appreciated.  Skin:  Skin is warm and dry. No rash noted. Psychiatric: Mood and affect are normal. Speech and behavior are normal.  ____________________________________________   LABS (all labs ordered are listed, but only abnormal results are displayed)  Labs Reviewed  BASIC METABOLIC PANEL  CBC  TROPONIN I   ____________________________________________  EKG  ED ECG REPORT I, Dionne Bucy, the attending physician, personally viewed and interpreted this ECG.  Date: 05/02/2018 EKG Time: 1956 Rate: 59 Rhythm: normal sinus rhythm QRS Axis: normal Intervals: normal ST/T Wave abnormalities: normal Narrative Interpretation: no evidence of acute ischemia  ____________________________________________  RADIOLOGY  CXR: No focal infiltrate or other acute abnormality  ____________________________________________   PROCEDURES  Procedure(s) performed: No  Procedures  Critical Care performed: No ____________________________________________   INITIAL IMPRESSION / ASSESSMENT AND PLAN / ED COURSE  Pertinent labs & imaging results that were available during my care of the patient were reviewed by me and considered in my medical decision making (see chart for details).  53 year old female with PMH as noted above presents with atypical chest pain over the last week, described as intermittent, like a soreness, and worse with certain positions and motions.  It started to recur after she had had a cough for a few weeks.  The patient has no significant ACS or PE risk factors.  On exam the patient is well-appearing and her vital signs are normal.  Her exam is unremarkable.  Her EKG shows no acute abnormalities.  Her chest x-ray is clear.  Lab work-up obtained from triage including troponin is all within normal limits.  The presentation is consistent with chest wall pain, most likely costochondritis related to her  prior cough, given that it is reproducible with certain positions and movements.  It is very atypical for ACS.  Given the duration of the symptoms there is no indication for repeat troponin.  The patient has no PE risk factors, no signs or symptoms of DVT, and normal vital signs so there is nothing to suggest PE.  Her risk is extremely low.  Although she is 54 and therefore cannot be ruled out by [facility], there is no indication for further PE testing based on the symptoms.  I discussed the results of the work-up with the patient.  She feels comfortable and wants to go home.  She is stable for discharge at this time.  I will prescribe her Naprosyn and Flexeril.  Return precautions given, and she expresses understanding.  ____________________________________________   FINAL CLINICAL IMPRESSION(S) / ED DIAGNOSES  Final diagnoses:  Chest wall pain      NEW MEDICATIONS STARTED DURING THIS VISIT:  New Prescriptions   CYCLOBENZAPRINE (FLEXERIL) 10 MG TABLET  Take 1 tablet (10 mg total) by mouth 3 (three) times daily as needed for up to 5 days for muscle spasms.   NAPROXEN (NAPROSYN) 500 MG TABLET    Take 1 tablet (500 mg total) by mouth 2 (two) times daily with a meal for 15 days.     Note:  This document was prepared using Dragon voice recognition software and may include unintentional dictation errors.    Dionne BucySiadecki, Natanya Holecek, MD 05/02/18 [phone]

## 2018-10-02 ENCOUNTER — Other Ambulatory Visit: Payer: Self-pay

## 2018-10-02 ENCOUNTER — Emergency Department: Payer: 59

## 2018-10-02 ENCOUNTER — Emergency Department
Admission: EM | Admit: 2018-10-02 | Discharge: 2018-10-02 | Disposition: A | Discharge status: Home / Self Care | Payer: 59 | Attending: Emergency Medicine | Admitting: Emergency Medicine

## 2018-10-02 ENCOUNTER — Encounter: Payer: Self-pay | Admitting: Emergency Medicine

## 2018-10-02 DIAGNOSIS — R079 Chest pain, unspecified: Secondary | ICD-10-CM | POA: Insufficient documentation

## 2018-10-02 LAB — BASIC METABOLIC PANEL
Anion gap: 8 (ref 5–15)
BUN: 14 mg/dL (ref 6–20)
CO2: 30 mmol/L (ref 22–32)
Calcium: 9.2 mg/dL (ref 8.9–10.3)
Chloride: 102 mmol/L (ref 98–111)
Creatinine, Ser: 0.8 mg/dL (ref 0.44–1.00)
GFR calc Af Amer: >60 mL/min (ref >60)
GFR calc non Af Amer: >60 mL/min (ref >60)
Glucose, Bld: 113 mg/dL — ABNORMAL HIGH (ref 70–99)
Potassium: 3.4 mmol/L — ABNORMAL LOW (ref 3.5–5.1)
Sodium: 140 mmol/L (ref 135–145)

## 2018-10-02 LAB — CBC
HCT: 39.4 % (ref 36.0–46.0)
Hemoglobin: 13 g/dL (ref 12.0–15.0)
MCH: 30.3 pg (ref 26.0–34.0)
MCHC: 33 g/dL (ref 30.0–36.0)
MCV: 91.8 fL (ref 80.0–100.0)
Platelets: 344 10*3/uL (ref 150–400)
RBC: 4.29 MIL/uL (ref 3.87–5.11)
RDW: 11.8 % (ref 11.5–15.5)
WBC: 6.5 10*3/uL (ref 4.0–10.5)
nRBC: 0 % (ref 0.0–0.2)

## 2018-10-02 LAB — TROPONIN I: Troponin I: <0.03 ng/mL (ref <0.03)

## 2018-10-02 MED ORDER — SODIUM CHLORIDE 0.9% FLUSH: 3.0000 mL | Freq: Once | INTRAVENOUS | Status: DC

## 2018-10-02 NOTE — ED Provider Notes (Signed)
[facility] Emergency Department Provider Note   ____________________________________________   I have reviewed the triage vital signs and the nursing notes.   HISTORY  Chief Complaint Chest Pain   History limited by: Not Limited   HPI Teresa Potter is a 53 y.o. female who presents to the emergency department today because of concerns for chest pain.  She states it started when she was getting ready to paint her bathroom.  Is located in her center chest.  He did have some radiation up into her jaw and into her back.  Episode lasted about 30 minutes.  By the time of my exam she stated she was pain-free.  She describes the pain as being burning and sharp.  She did have some associated shortness of breath but is not sure if that was due to anxiety.  Has had similar pain in the past but never this intense.  Patient denies any unusual activity or ingestion earlier today.   Records reviewed. Per medical record review patient has a history of ED visit for chest pain in January without any concerning findings on work up.   Past Medical History:  Diagnosis Date  . Anemia   . H/O varicella     There are no active problems to display for this patient.   Past Surgical History:  Procedure Laterality Date  . ABDOMINAL HYSTERECTOMY  2007   SUPRA-CERVICAL  . APPENDECTOMY    . COLON SURGERY     to decrease length of colon  . WISDOM TOOTH EXTRACTION      Prior to Admission medications   Medication Sig Start Date End Date Taking? Authorizing Provider  polyethylene glycol-electrolytes (TRILYTE) 420 g solution Take 4,000 mLs by mouth once. 06/13/15   Emily FilbertWilliams, Jonathan E, MD    Allergies Other  Family History  Problem Relation Age of Onset  . Heart disease Paternal Uncle   . Heart disease Paternal Uncle   . Heart disease Paternal Uncle   . Heart disease Paternal Uncle   . Cancer Maternal Aunt        COLON  . Cancer Maternal Uncle        COLON  .  Hypertension Mother     Social History Social History   Tobacco Use  . Smoking status: Never Smoker  . Smokeless tobacco: Never Used  Substance Use Topics  . Alcohol use: Yes    Comment: OCCASIONAL  . Drug use: No    Review of Systems Constitutional: No fever/chills Eyes: No visual changes. ENT: No sore throat. Cardiovascular: Positive for chest pain Respiratory: Positive for shortness of breath. Gastrointestinal: No abdominal pain.  No nausea, no vomiting.  No diarrhea.   Genitourinary: Negative for dysuria. Musculoskeletal: Negative for back pain. Skin: Negative for rash. Neurological: Negative for headaches, focal weakness or numbness.  ____________________________________________   PHYSICAL EXAM:  VITAL SIGNS: ED Triage Vitals  Enc Vitals Group     BP 10/02/18 1517 116/72     Pulse Rate 10/02/18 1517 74     Resp 10/02/18 1517 18     Temp 10/02/18 1517 98 F (36.7 C)     Temp Source 10/02/18 1517 Oral     SpO2 10/02/18 1517 99 %     Weight 10/02/18 1517 158 lb (71.7 kg)     Height 10/02/18 1517 5' 2.5" (1.588 m)     Head Circumference --      Peak Flow --      Pain Score 10/02/18 1523  6   Constitutional: Alert and oriented.  Eyes: Conjunctivae are normal.  ENT      Head: Normocephalic and atraumatic.      Nose: No congestion/rhinnorhea.      Mouth/Throat: Mucous membranes are moist.      Neck: No stridor. Hematological/Lymphatic/Immunilogical: No cervical lymphadenopathy. Cardiovascular: Normal rate, regular rhythm.  No murmurs, rubs, or gallops.  Respiratory: Normal respiratory effort without tachypnea nor retractions. Breath sounds are clear and equal bilaterally. No wheezes/rales/rhonchi. Gastrointestinal: Soft and non tender. No rebound. No guarding.  Genitourinary: Deferred Musculoskeletal: Normal range of motion in all extremities. No lower extremity edema. Neurologic:  Normal speech and language. No gross focal neurologic deficits are  appreciated.  Skin:  Skin is warm, dry and intact. No rash noted. Psychiatric: Mood and affect are normal. Speech and behavior are normal. Patient exhibits appropriate insight and judgment.  ____________________________________________    LABS (pertinent positives/negatives)  Trop <0.03 CBC wbc 6.5, hgb 13.0, plt 344 BMP wnl except k 3.4, glu 113  ____________________________________________   EKG  I, Nance Pear, attending physician, personally viewed and interpreted this EKG  EKG Time: 1516 Rate: 73 Rhythm: normal sinus rhythm Axis: normal Intervals: qtc 420 QRS: low voltage ST changes: no st elevation Impression: abnormal ekg  ____________________________________________    RADIOLOGY  CXR No acute abnormality ____________________________________________   PROCEDURES  Procedures  ____________________________________________   INITIAL IMPRESSION / ASSESSMENT AND PLAN / ED COURSE  Pertinent labs & imaging results that were available during my care of the patient were reviewed by me and considered in my medical decision making (see chart for details).   Patient presented to the emergency department today because of concerns for chest pain.  Patient low risk heart score.  I did have a discussion with the patient.  Did discuss getting a second troponin.  Patient felt comfortable deferring further testing at this time.  I think this is reasonable.  I do think patient is low risk for ACS. Additionally doubt PE/dissection given clinical history and exam.  We did however discuss follow-up with cardiology.  Discussed return precautions.  ____________________________________________   FINAL CLINICAL IMPRESSION(S) / ED DIAGNOSES  Final diagnoses:  Nonspecific chest pain     Note: This dictation was prepared with Dragon dictation. Any transcriptional errors that result from this process are unintentional     Nance Pear, MD 10/02/18 1708

## 2018-10-02 NOTE — Discharge Instructions (Addendum)
Please seek medical attention for any high fevers, chest pain, shortness of breath, change in behavior, persistent vomiting, bloody stool or any other new or concerning symptoms.  

## 2018-10-02 NOTE — ED Notes (Signed)
Pt verbalized understanding of discharge instructions. NAD at this time. 

## 2018-10-02 NOTE — ED Triage Notes (Signed)
Pt arrived via POV with reports of chest pain that started abotu 30 minutes PTA, pt states she was starting to paint when the pain started, pt states she thought she had indigestion but states the pain never went away. Pt states the pain was in the middle of her chest radiating up the left side of her neck and face.  Pt states the pain  Was more intense indigestion.  Pt reports shortness of breath and back pain when the pain started as well as some mild nausea.

## 2018-10-21 ENCOUNTER — Ambulatory Visit: Payer: 59 | Admitting: Internal Medicine

## 2018-12-06 ENCOUNTER — Other Ambulatory Visit (HOSPITAL_COMMUNITY)
Admission: RE | Admit: 2018-12-06 | Discharge: 2018-12-06 | Disposition: A | Discharge status: Home / Self Care | Payer: 59 | Source: Ambulatory Visit | Attending: Obstetrics and Gynecology | Admitting: Obstetrics and Gynecology

## 2018-12-06 ENCOUNTER — Other Ambulatory Visit: Payer: Self-pay

## 2018-12-06 ENCOUNTER — Encounter: Payer: Self-pay | Admitting: Obstetrics and Gynecology

## 2018-12-06 ENCOUNTER — Ambulatory Visit (INDEPENDENT_AMBULATORY_CARE_PROVIDER_SITE_OTHER): Payer: 59 | Admitting: Obstetrics and Gynecology

## 2018-12-06 VITALS — BP 100/70 | Ht 62.5 in | Wt 164.4 lb

## 2018-12-06 DIAGNOSIS — Z1151 Encounter for screening for human papillomavirus (HPV): Secondary | ICD-10-CM | POA: Insufficient documentation

## 2018-12-06 DIAGNOSIS — Z01419 Encounter for gynecological examination (general) (routine) without abnormal findings: Secondary | ICD-10-CM | POA: Diagnosis not present

## 2018-12-06 DIAGNOSIS — N951 Menopausal and female climacteric states: Secondary | ICD-10-CM | POA: Insufficient documentation

## 2018-12-06 DIAGNOSIS — Z13220 Encounter for screening for lipoid disorders: Secondary | ICD-10-CM

## 2018-12-06 DIAGNOSIS — Z124 Encounter for screening for malignant neoplasm of cervix: Secondary | ICD-10-CM | POA: Diagnosis present

## 2018-12-06 DIAGNOSIS — Z1239 Encounter for other screening for malignant neoplasm of breast: Secondary | ICD-10-CM

## 2018-12-06 DIAGNOSIS — Z79890 Hormone replacement therapy (postmenopausal): Secondary | ICD-10-CM

## 2018-12-06 MED ORDER — ESTRADIOL-NORETHINDRONE ACET 1-0.5 MG PO TABS: 1.0000 | ORAL_TABLET | Freq: Every day | ORAL | 0 refills | Status: DC

## 2018-12-06 NOTE — Patient Instructions (Signed)
I value your feedback and entrusting us with your care. If you get a [facility] patient survey, I would appreciate you taking the time to let us know about your experience today. Thank you! 

## 2018-12-06 NOTE — Progress Notes (Signed)
PCP: Gearldine Shown, DO   Chief Complaint  Patient presents with  . Hot Flashes    major weight gain, emotional, tired since last october 2019    HPI:      [name] is a 53 y.o. [phone] who LMP was No LMP recorded. Patient has had a hysterectomy., presents today for her NP annual examination and menopause eval, referred by PCP.  Her menses are absent due to lap supracervical hyst for leio, still has ovaries. She does not have intermenstrual bleeding. She does have vasomotor sx, as well as emotional sx, anxiety/depression, insomnia, memory loss, moodiness, and vaginal dryness. Sx for awhile but worse past 3-4 months. Pt interested in HRT.   Sex activity: single partner, contraception - status post hysterectomy. She does have vaginal dryness, improved with vag dryness.  Last Pap: 11/12/11 Results were: no abnormalities /neg HPV DNA.  Hx of STDs: none  Last mammogram: 12/2016 at [facility]. Had neg breast bx due to LT breast calcification.  There is a FH of breast cancer in her mat aunt, genetic testing not indicated. There is no FH of ovarian cancer. The patient does not do self-breast exams.  Colonoscopy: 03/2017 at [facility];  Repeat due after 10 years.   Tobacco use: The patient denies current or previous tobacco use. Alcohol use: social drinker Exercise: moderately active  She does get adequate calcium and Vitamin D in her diet.  Labs with PCP recently but no recent lipid panel.   Past Medical History:  Diagnosis Date  . Anemia   . Breast calcification, left 2018  . H/O varicella     Past Surgical History:  Procedure Laterality Date  . APPENDECTOMY    . BREAST BIOPSY Left 2018   microcalcifications; neg.   . COLON SURGERY     to decrease length of colon  . COLONOSCOPY  2018   repeat after 10 yrs, done at [facility]  . LAPAROSCOPIC SUPRACERVICAL HYSTERECTOMY  2007   leio  . WISDOM TOOTH EXTRACTION      Family History  Problem Relation Age of Onset  .  Heart disease Paternal Uncle   . Heart disease Paternal Uncle   . Heart disease Paternal Uncle   . Heart disease Paternal Uncle   . Cancer Maternal Aunt        COLON  . Breast cancer Maternal Aunt 9  . Cancer Maternal Uncle        COLON  . Hypertension Mother     Social History   Socioeconomic History  . Marital status: Married    Spouse name: Not on file  . Number of children: Not on file  . Years of education: Not on file  . Highest education level: Not on file  Occupational History  . Not on file  Social Needs  . Financial resource strain: Not on file  . Food insecurity    Worry: Not on file    Inability: Not on file  . Transportation needs    Medical: Not on file    Non-medical: Not on file  Tobacco Use  . Smoking status: Never Smoker  . Smokeless tobacco: Never Used  Substance and Sexual Activity  . Alcohol use: Yes    Comment: OCCASIONAL  . Drug use: No  . Sexual activity: Yes    Birth control/protection: Surgical    Comment: BTL AND PT HAD HYST  Lifestyle  . Physical activity    Days per week: Not on file  Minutes per session: Not on file  . Stress: Not on file  Relationships  . Social Musicianconnections    Talks on phone: Not on file    Gets together: Not on file    Attends religious service: Not on file    Active member of club or organization: Not on file    Attends meetings of clubs or organizations: Not on file    Relationship status: Not on file  . Intimate partner violence    Fear of current or ex partner: Not on file    Emotionally abused: Not on file    Physically abused: Not on file    Forced sexual activity: Not on file  Other Topics Concern  . Not on file  Social History Narrative  . Not on file    Outpatient Medications Prior to Visit  Medication Sig Dispense Refill  . phentermine (ADIPEX-P) 37.5 MG tablet Take by mouth.    . Vitamin D, Ergocalciferol, (DRISDOL) 1.25 MG (50000 UT) CAPS capsule TAKE 1 CAPSULE (50,000 UNITS TOTAL) BY  MOUTH ONCE A WEEK FOR 12 DOSES    . polyethylene glycol-electrolytes (TRILYTE) 420 g solution Take 4,000 mLs by mouth once. 4000 mL 0   No facility-administered medications prior to visit.        ROS:  Review of Systems  Constitutional: Negative for fatigue, fever and unexpected weight change.  Respiratory: Negative for cough, shortness of breath and wheezing.   Cardiovascular: Negative for chest pain, palpitations and leg swelling.  Gastrointestinal: Negative for blood in stool, constipation, diarrhea, nausea and vomiting.  Endocrine: Negative for cold intolerance, heat intolerance and polyuria.  Genitourinary: Negative for dyspareunia, dysuria, flank pain, frequency, genital sores, hematuria, menstrual problem, pelvic pain, urgency, vaginal bleeding, vaginal discharge and vaginal pain.  Musculoskeletal: Negative for back pain, joint swelling and myalgias.  Skin: Negative for rash.  Neurological: Negative for dizziness, syncope, light-headedness, numbness and headaches.  Hematological: Negative for adenopathy.  Psychiatric/Behavioral: Negative for agitation, confusion, sleep disturbance and suicidal ideas. The patient is not nervous/anxious.   BREAST: No symptoms    Objective: BP 100/70   Ht 5' 2.5" (1.588 m)   Wt 164 lb 6.4 oz (74.6 kg)   BMI 29.59 kg/m    Physical Exam Constitutional:      Appearance: She is well-developed.  Genitourinary:     Vulva, vagina, cervix, right adnexa and left adnexa normal.     No vaginal discharge, erythema or tenderness.     Uterus is absent.     No right or left adnexal mass present.     Right adnexa not tender.     Left adnexa not tender.     Genitourinary Comments: UTERUS SURG REM  Neck:     Musculoskeletal: Normal range of motion.     Thyroid: No thyromegaly.  Cardiovascular:     Rate and Rhythm: Normal rate and regular rhythm.     Heart sounds: Normal heart sounds. No murmur.  Pulmonary:     Effort: Pulmonary effort is  normal.     Breath sounds: Normal breath sounds.  Chest:     Breasts:        Right: No mass, nipple discharge, skin change or tenderness.        Left: No mass, nipple discharge, skin change or tenderness.  Abdominal:     Palpations: Abdomen is soft.     Tenderness: There is no abdominal tenderness. There is no guarding.  Musculoskeletal: Normal range of motion.  Neurological:  General: No focal deficit present.     Mental Status: She is alert and oriented to person, place, and time.     Cranial Nerves: No cranial nerve deficit.  Skin:    General: Skin is warm and dry.  Psychiatric:        Mood and Affect: Mood normal.        Behavior: Behavior normal.        Thought Content: Thought content normal.        Judgment: Judgment normal.  Vitals signs reviewed.     Assessment/Plan:  Encounter for annual routine gynecological examination -   Cervical cancer screening - Plan:   Screening for HPV (human papillomavirus) - Plan:   Screening for breast cancer - Plan: MM 3D SCREEN BREAST BILATERAL,Pt to sched mammo.  Hormone replacement therapy (HRT) - Plan: estradiol-norethindrone (ACTIVELLA) 1-0.5 MG tablet, Rx activella. F/u via phone re: sx in 3 months/sooner prn.  Vasomotor symptoms due to menopause - Plan: estradiol-norethindrone (ACTIVELLA) 1-0.5 MG tablet, Treatment options including HRT, paxil/effexor and gabapentin discussed. Pt wants to try HRT.   Screening cholesterol level--pt to RTO for fasting labs. Current on all other labs.   Meds ordered this encounter  Medications  . estradiol-norethindrone (ACTIVELLA) 1-0.5 MG tablet    Sig: Take 1 tablet by mouth daily.    Dispense:  90 tablet    Refill:  0    Order Specific Question:   Supervising Provider    Answer:   Nadara MustardHARRIS, ROBERT P [[id number]]            GYN counsel breast self exam, mammography screening, menopause, adequate intake of calcium and vitamin D, diet and exercise    F/U  Return in about 1 year (around  12/06/2019).  Alicia B. Copland, PA-C 12/06/2018 3:12 PM

## 2018-12-07 ENCOUNTER — Other Ambulatory Visit: Payer: 59

## 2018-12-07 DIAGNOSIS — Z13220 Encounter for screening for lipoid disorders: Secondary | ICD-10-CM

## 2018-12-08 ENCOUNTER — Encounter: Payer: Self-pay | Admitting: Obstetrics and Gynecology

## 2018-12-08 LAB — LIPID PANEL
Chol/HDL Ratio: 4 ratio (ref 0.0–4.4)
Cholesterol, Total: 170 mg/dL (ref 100–199)
HDL: 43 mg/dL (ref >39)
LDL Calculated: 108 mg/dL — ABNORMAL HIGH (ref 0–99)
Triglycerides: 96 mg/dL (ref 0–149)
VLDL Cholesterol Cal: 19 mg/dL (ref 5–40)

## 2018-12-08 LAB — CYTOLOGY - PAP
Diagnosis: NEGATIVE
HPV: NOT DETECTED

## 2019-03-12 ENCOUNTER — Other Ambulatory Visit: Payer: Self-pay | Admitting: Obstetrics and Gynecology

## 2019-03-12 DIAGNOSIS — Z79890 Hormone replacement therapy (postmenopausal): Secondary | ICD-10-CM

## 2019-03-12 DIAGNOSIS — N951 Menopausal and female climacteric states: Secondary | ICD-10-CM

## 2019-03-22 ENCOUNTER — Other Ambulatory Visit: Payer: Self-pay | Admitting: Obstetrics and Gynecology

## 2019-03-22 DIAGNOSIS — N951 Menopausal and female climacteric states: Secondary | ICD-10-CM

## 2019-03-22 DIAGNOSIS — Z79890 Hormone replacement therapy (postmenopausal): Secondary | ICD-10-CM

## 2019-04-01 ENCOUNTER — Other Ambulatory Visit: Payer: Self-pay | Admitting: Obstetrics and Gynecology

## 2019-04-01 DIAGNOSIS — N951 Menopausal and female climacteric states: Secondary | ICD-10-CM

## 2019-04-01 DIAGNOSIS — Z79890 Hormone replacement therapy (postmenopausal): Secondary | ICD-10-CM

## 2019-04-01 NOTE — Telephone Encounter (Signed)
Please advise 

## 2019-04-04 MED ORDER — ESTRADIOL-NORETHINDRONE ACET 1-0.5 MG PO TABS: 1.0000 | ORAL_TABLET | Freq: Every day | ORAL | 2 refills | Status: DC

## 2019-04-04 NOTE — Telephone Encounter (Signed)
Cont.. 

## 2019-10-30 IMAGING — DX CHEST  1 VIEW
1 series · 1 of 1 positions shown · non-contrast
Comparison: May 01, 2018

CLINICAL DATA: Chest pain.

EXAM:
CHEST  1 VIEW

[chest ap]
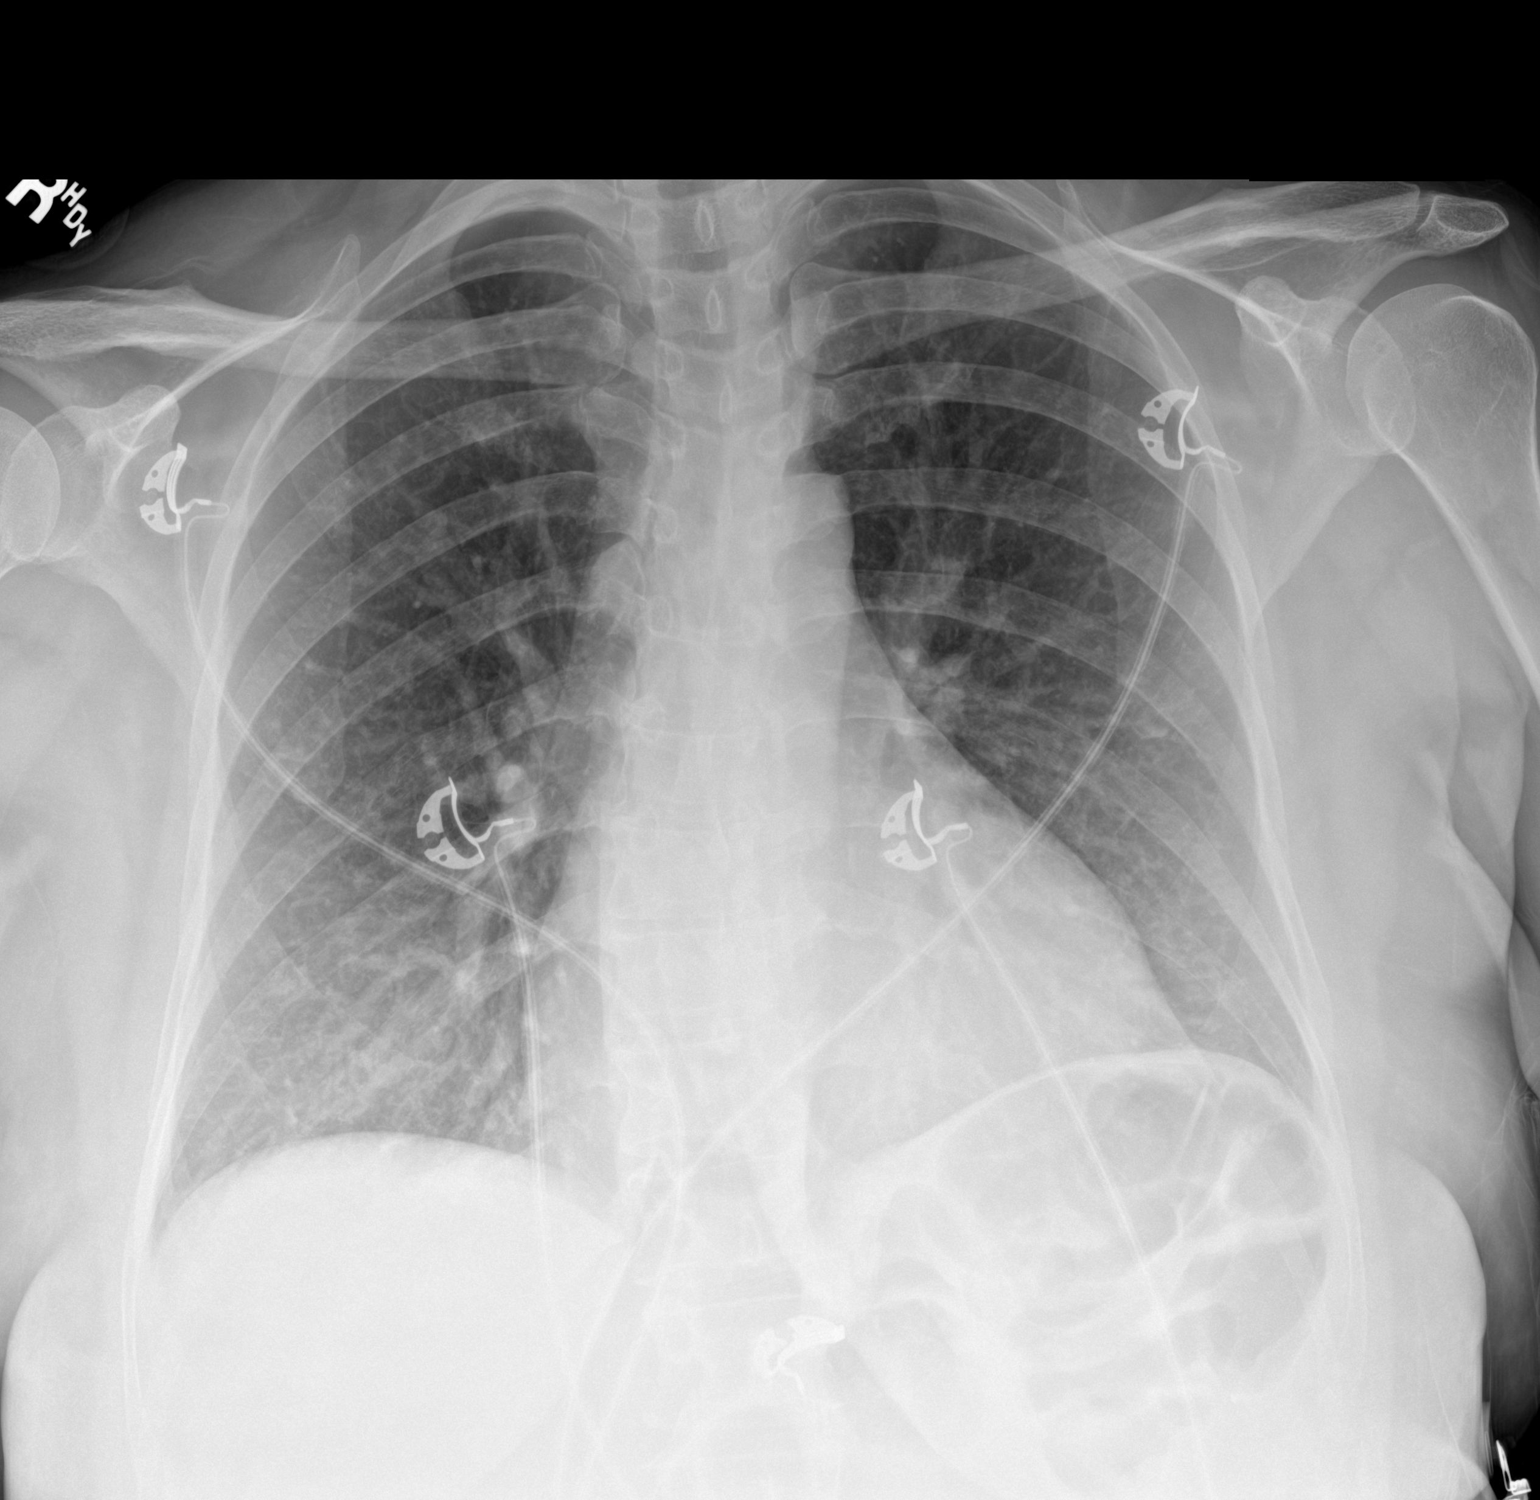

[1 of 1 positions shown; findings below may reference images not displayed]

FINDINGS: The heart size and mediastinal contours are within normal limits.
Both lungs are clear. The visualized skeletal structures are
unremarkable.
IMPRESSION: No active disease.

## 2019-11-14 ENCOUNTER — Other Ambulatory Visit: Payer: Self-pay | Admitting: Internal Medicine

## 2019-11-14 DIAGNOSIS — Z1231 Encounter for screening mammogram for malignant neoplasm of breast: Secondary | ICD-10-CM

## 2020-04-09 ENCOUNTER — Other Ambulatory Visit: Payer: Self-pay | Admitting: Obstetrics and Gynecology

## 2020-04-09 DIAGNOSIS — N951 Menopausal and female climacteric states: Secondary | ICD-10-CM

## 2020-04-09 DIAGNOSIS — Z79890 Hormone replacement therapy (postmenopausal): Secondary | ICD-10-CM

## 2020-04-18 ENCOUNTER — Other Ambulatory Visit: Payer: Self-pay | Admitting: Obstetrics and Gynecology

## 2020-04-18 DIAGNOSIS — Z79890 Hormone replacement therapy (postmenopausal): Secondary | ICD-10-CM

## 2020-04-18 DIAGNOSIS — N951 Menopausal and female climacteric states: Secondary | ICD-10-CM

## 2020-08-12 DIAGNOSIS — J019 Acute sinusitis, unspecified: Secondary | ICD-10-CM | POA: Diagnosis not present

## 2020-09-21 DIAGNOSIS — L218 Other seborrheic dermatitis: Secondary | ICD-10-CM | POA: Diagnosis not present

## 2021-03-29 DIAGNOSIS — Z780 Asymptomatic menopausal state: Secondary | ICD-10-CM | POA: Diagnosis not present

## 2021-03-29 DIAGNOSIS — E559 Vitamin D deficiency, unspecified: Secondary | ICD-10-CM | POA: Diagnosis not present

## 2021-03-29 DIAGNOSIS — R002 Palpitations: Secondary | ICD-10-CM | POA: Diagnosis not present

## 2021-03-29 DIAGNOSIS — N951 Menopausal and female climacteric states: Secondary | ICD-10-CM | POA: Diagnosis not present

## 2021-03-29 DIAGNOSIS — Z23 Encounter for immunization: Secondary | ICD-10-CM | POA: Diagnosis not present

## 2021-04-11 ENCOUNTER — Other Ambulatory Visit: Payer: Self-pay | Admitting: Internal Medicine

## 2021-04-11 DIAGNOSIS — Z1231 Encounter for screening mammogram for malignant neoplasm of breast: Secondary | ICD-10-CM | POA: Diagnosis not present

## 2021-04-11 DIAGNOSIS — H1033 Unspecified acute conjunctivitis, bilateral: Secondary | ICD-10-CM | POA: Diagnosis not present

## 2021-04-11 DIAGNOSIS — R002 Palpitations: Secondary | ICD-10-CM | POA: Diagnosis not present

## 2021-06-26 DIAGNOSIS — H1030 Unspecified acute conjunctivitis, unspecified eye: Secondary | ICD-10-CM | POA: Diagnosis not present

## 2021-07-09 DIAGNOSIS — R053 Chronic cough: Secondary | ICD-10-CM | POA: Diagnosis not present

## 2021-10-11 ENCOUNTER — Ambulatory Visit (HOSPITAL_BASED_OUTPATIENT_CLINIC_OR_DEPARTMENT_OTHER)
Admission: RE | Admit: 2021-10-11 | Discharge: 2021-10-11 | Disposition: A | Discharge status: Home / Self Care | Payer: BC Managed Care – PPO | Source: Ambulatory Visit | Attending: Diagnostic Radiology | Admitting: Diagnostic Radiology

## 2021-10-11 DIAGNOSIS — Z1231 Encounter for screening mammogram for malignant neoplasm of breast: Secondary | ICD-10-CM | POA: Diagnosis not present

## 2021-10-30 DIAGNOSIS — H1013 Acute atopic conjunctivitis, bilateral: Secondary | ICD-10-CM | POA: Diagnosis not present

## 2021-11-15 ENCOUNTER — Encounter: Payer: Self-pay | Admitting: Obstetrics and Gynecology

## 2021-11-15 ENCOUNTER — Ambulatory Visit (INDEPENDENT_AMBULATORY_CARE_PROVIDER_SITE_OTHER): Payer: BC Managed Care – PPO | Admitting: Obstetrics and Gynecology

## 2021-11-15 VITALS — BP 124/78 | Ht 62.5 in | Wt 169.2 lb

## 2021-11-15 DIAGNOSIS — Z1231 Encounter for screening mammogram for malignant neoplasm of breast: Secondary | ICD-10-CM

## 2021-11-15 DIAGNOSIS — Z130 Encounter for screening for diseases of the blood and blood-forming organs and certain disorders involving the immune mechanism: Secondary | ICD-10-CM | POA: Diagnosis not present

## 2021-11-15 DIAGNOSIS — Z01419 Encounter for gynecological examination (general) (routine) without abnormal findings: Secondary | ICD-10-CM | POA: Diagnosis not present

## 2021-11-15 DIAGNOSIS — Z800 Family history of malignant neoplasm of digestive organs: Secondary | ICD-10-CM | POA: Diagnosis not present

## 2021-11-15 DIAGNOSIS — Z79890 Hormone replacement therapy (postmenopausal): Secondary | ICD-10-CM | POA: Diagnosis not present

## 2021-11-15 DIAGNOSIS — Z803 Family history of malignant neoplasm of breast: Secondary | ICD-10-CM | POA: Diagnosis not present

## 2021-11-15 DIAGNOSIS — R5382 Chronic fatigue, unspecified: Secondary | ICD-10-CM

## 2021-11-15 DIAGNOSIS — N951 Menopausal and female climacteric states: Secondary | ICD-10-CM | POA: Diagnosis not present

## 2021-11-15 DIAGNOSIS — Z0000 Encounter for general adult medical examination without abnormal findings: Secondary | ICD-10-CM | POA: Diagnosis not present

## 2021-11-15 DIAGNOSIS — R0683 Snoring: Secondary | ICD-10-CM

## 2021-11-15 MED ORDER — ESTRADIOL-NORETHINDRONE ACET 1-0.5 MG PO TABS: 1.0000 | ORAL_TABLET | Freq: Every day | ORAL | 3 refills | Status: DC

## 2021-11-15 NOTE — Progress Notes (Signed)
PCP: Gearldine Shown, DO   Chief Complaint  Patient presents with   Gynecologic Exam    HPI:      [name] is a 56 y.o. [phone] who LMP was No LMP recorded. Patient has had a hysterectomy., presents today for her annual examination.  Her menses are absent due to lap supracervical hyst for leio, still has ovaries. She does not have PMB.  She did have vasomotor sx, as well as emotional sx, anxiety/depression, insomnia, memory loss, moodiness, and vaginal dryness last yr, improved with HRT. Would like to continue Rx.   Sex activity: single partner, contraception - status post hysterectomy. No pain/bleeding.   Last Pap: 12/06/18 Results were: no abnormalities /neg HPV DNA.  Hx of STDs: none  Last mammogram: 09/2021 at [facility] per pt (I can't see results in [place]). Had neg breast bx due to LT breast calcification in past.  There is a FH of breast and colon cancer in her mat aunt, genetic testing not done There is no FH of ovarian cancer. The patient  self-breast exams.  Colonoscopy: 03/[facility];  Repeat due after 10 years.   Tobacco use: The patient denies current or previous tobacco use. Alcohol use: social drinker No drug use Exercise: not active  She does get adequate calcium but not Vitamin D in her diet. Would like labs for CMP and CBC; hx of anemia in past but now postmenopausal. Pt has been having LBP and wonders about kidneys. 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   Pt with increased fatigue. Sleeps about 6 hrs nightly, not exercising, not drinking enough water. Does snore per her husband.    Past Medical History:  Diagnosis Date   Anemia    Breast calcification, left 2018   H/O varicella     Past Surgical History:  Procedure Laterality Date   APPENDECTOMY     BREAST BIOPSY Left 2018   microcalcifications; neg.    COLON SURGERY     to decrease length of colon   COLONOSCOPY  2018   repeat after 10 yrs, done at [facility]  2007   leio   WISDOM TOOTH EXTRACTION      Family History  Problem Relation Age of Onset   Heart disease Paternal Uncle    Heart disease Paternal Uncle    Heart disease Paternal Uncle    Heart disease Paternal Uncle    Cancer Maternal Aunt        COLON   Breast cancer Maternal Aunt 61   Cancer Maternal Uncle        COLON   Hypertension Mother     Social History   Socioeconomic History   Marital status: Married    Spouse name: Not on file   Number of children: Not on file   Years of education: Not on file   Highest education level: Not on file  Occupational History   Not on file  Tobacco Use   Smoking status: Never   Smokeless tobacco: Never  Vaping Use   Vaping Use: Never used  Substance and Sexual Activity   Alcohol use: Yes    Comment: OCCASIONAL   Drug use: No   Sexual activity: Yes    Birth control/protection: Surgical    Comment: BTL AND PT HAD HYST  Other Topics Concern   Not on file  Social History Narrative   Not on file   Social Determinants of Health   Financial  Resource Strain: Not on file  Food Insecurity: Not on file  Transportation Needs: Not on file  Physical Activity: Not on file  Stress: Not on file  Social Connections: Not on file  Intimate Partner Violence: Not on file    Outpatient Medications Prior to Visit  Medication Sig Dispense Refill   MIMVEY 1-0.5 MG tablet TAKE 1 TABLET BY MOUTH EVERY DAY 30 tablet 8   phentermine (ADIPEX-P) 37.5 MG tablet Take by mouth. (Patient not taking: Reported on 11/15/2021)     Vitamin D, Ergocalciferol, (DRISDOL) 1.25 MG (50000 UT) CAPS capsule TAKE 1 CAPSULE (50,000 UNITS TOTAL) BY MOUTH ONCE A WEEK FOR 12 DOSES (Patient not taking: Reported on 11/15/2021)     No facility-administered medications prior to visit.       ROS:  Review of Systems  Constitutional:  Negative for fatigue, fever and unexpected weight change.  Respiratory:  Negative for cough,  shortness of breath and wheezing.   Cardiovascular:  Negative for chest pain, palpitations and leg swelling.  Gastrointestinal:  Negative for blood in stool, constipation, diarrhea, nausea and vomiting.  Endocrine: Negative for cold intolerance, heat intolerance and polyuria.  Genitourinary:  Negative for dyspareunia, dysuria, flank pain, frequency, genital sores, hematuria, menstrual problem, pelvic pain, urgency, vaginal bleeding, vaginal discharge and vaginal pain.  Musculoskeletal:  Negative for back pain, joint swelling and myalgias.  Skin:  Negative for rash.  Neurological:  Negative for dizziness, syncope, light-headedness, numbness and headaches.  Hematological:  Negative for adenopathy.  Psychiatric/Behavioral:  Negative for agitation, confusion, sleep disturbance and suicidal ideas. The patient is not nervous/anxious.   BREAST: No symptoms    Objective: BP 124/78   Ht 5' 2.5" (1.588 m)   Wt 169 lb 3.2 oz (76.7 kg)   BMI 30.45 kg/m    Physical Exam Constitutional:      Appearance: She is well-developed.  Genitourinary:     Vulva normal.     Genitourinary Comments: UTERUS SURG REM     No vaginal discharge, erythema or tenderness.      Right Adnexa: not tender and no mass present.    Left Adnexa: not tender and no mass present.    Uterus is absent.  Breasts:    Right: No mass, nipple discharge, skin change or tenderness.     Left: No mass, nipple discharge, skin change or tenderness.  Neck:     Thyroid: No thyromegaly.  Cardiovascular:     Rate and Rhythm: Normal rate and regular rhythm.     Heart sounds: Normal heart sounds. No murmur heard. Pulmonary:     Effort: Pulmonary effort is normal.     Breath sounds: Normal breath sounds.  Abdominal:     Palpations: Abdomen is soft.     Tenderness: There is no abdominal tenderness. There is no guarding.  Musculoskeletal:        General: Normal range of motion.     Cervical back: Normal range of motion.   Neurological:     General: No focal deficit present.     Mental Status: She is alert and oriented to person, place, and time.     Cranial Nerves: No cranial nerve deficit.  Skin:    General: Skin is warm and dry.  Psychiatric:        Mood and Affect: Mood normal.        Behavior: Behavior normal.        Thought Content: Thought content normal.  Judgment: Judgment normal.  Vitals reviewed.     Assessment/Plan:  Encounter for annual routine gynecological examination  Encounter for screening mammogram for malignant neoplasm of breast - Plan: MM 3D SCREEN BREAST BILATERAL; pt to schedule mammo  Vasomotor symptoms due to menopause - Plan: estradiol-norethindrone (MIMVEY) 1-0.5 MG tablet; Rx RF.   Hormone replacement therapy (HRT) - Plan: estradiol-norethindrone (MIMVEY) 1-0.5 MG tablet  Family history of breast cancer - Plan: Integrated [facility] ([place]); MyRisk testing discussed and done today. Will f/u with results.   Blood tests for routine general physical examination - Plan: Comprehensive metabolic panel, CBC with Differential/Platelet  Screening for deficiency anemia - Plan: CBC with Differential/Platelet (pt request)  Fatigue--increase sleep, water, exercise, check labs. If sx persist and labs normal, will do sleep study.   Meds ordered this encounter  Medications   estradiol-norethindrone (MIMVEY) 1-0.5 MG tablet    Sig: Take 1 tablet by mouth daily.    Dispense:  90 tablet    Refill:  3    Order Specific Question:   Supervising Provider    Answer:   Renaldo Reel            GYN counsel breast self exam, mammography screening, menopause, adequate intake of calcium and vitamin D, diet and exercise    F/U  Return in about 1 year (around 11/16/2022).  Emry Barbato B. Inesha Sow, PA-C 11/15/2021 4:44 PM

## 2021-11-15 NOTE — Patient Instructions (Signed)
I value your feedback and you entrusting us with your care. If you get a [facility] patient survey, I would appreciate you taking the time to let us know about your experience today. Thank you!  [facility]: [phone]  [facility]: [phone]    

## 2021-11-16 LAB — CBC WITH DIFFERENTIAL/PLATELET
Basophils Absolute: 0 10*3/uL (ref 0.0–0.2)
Basos: 0 %
EOS (ABSOLUTE): 0.1 10*3/uL (ref 0.0–0.4)
Eos: 1 %
Hematocrit: 41 % (ref 34.0–46.6)
Hemoglobin: 13.6 g/dL (ref 11.1–15.9)
Immature Grans (Abs): 0 10*3/uL (ref 0.0–0.1)
Immature Granulocytes: 0 %
Lymphocytes Absolute: 2.1 10*3/uL (ref 0.7–3.1)
Lymphs: 30 %
MCH: 30.6 pg (ref 26.6–33.0)
MCHC: 33.2 g/dL (ref 31.5–35.7)
MCV: 92 fL (ref 79–97)
Monocytes Absolute: 0.7 10*3/uL (ref 0.1–0.9)
Monocytes: 10 %
Neutrophils Absolute: 4 10*3/uL (ref 1.4–7.0)
Neutrophils: 59 %
Platelets: 370 10*3/uL (ref 150–450)
RBC: 4.44 x10E6/uL (ref 3.77–5.28)
RDW: 11.8 % (ref 11.7–15.4)
WBC: 6.8 10*3/uL (ref 3.4–10.8)

## 2021-11-16 LAB — COMPREHENSIVE METABOLIC PANEL
ALT: 14 IU/L (ref 0–32)
AST: 13 IU/L (ref 0–40)
Albumin/Globulin Ratio: 1.6 (ref 1.2–2.2)
Albumin: 4.5 g/dL (ref 3.8–4.9)
Alkaline Phosphatase: 54 IU/L (ref 44–121)
BUN/Creatinine Ratio: 12 (ref 9–23)
BUN: 11 mg/dL (ref 6–24)
Bilirubin Total: 0.4 mg/dL (ref 0.0–1.2)
CO2: 26 mmol/L (ref 20–29)
Calcium: 9.3 mg/dL (ref 8.7–10.2)
Chloride: 100 mmol/L (ref 96–106)
Creatinine, Ser: 0.91 mg/dL (ref 0.57–1.00)
Globulin, Total: 2.8 g/dL (ref 1.5–4.5)
Glucose: 82 mg/dL (ref 70–99)
Potassium: 4.1 mmol/L (ref 3.5–5.2)
Sodium: 139 mmol/L (ref 134–144)
Total Protein: 7.3 g/dL (ref 6.0–8.5)
eGFR: 75 mL/min/{1.73_m2} (ref >59)

## 2021-11-17 NOTE — Addendum Note (Signed)
Addended by: Althea Grimmer B on: 11/17/2021 06:09 PM   Modules accepted: Orders

## 2021-11-19 DIAGNOSIS — Z1371 Encounter for nonprocreative screening for genetic disease carrier status: Secondary | ICD-10-CM

## 2021-11-19 HISTORY — DX: Encounter for nonprocreative screening for genetic disease carrier status: Z13.71

## 2021-11-27 DIAGNOSIS — H1013 Acute atopic conjunctivitis, bilateral: Secondary | ICD-10-CM | POA: Diagnosis not present

## 2021-12-03 ENCOUNTER — Encounter: Payer: Self-pay | Admitting: Obstetrics and Gynecology

## 2022-01-14 ENCOUNTER — Encounter: Payer: Self-pay | Admitting: Obstetrics and Gynecology

## 2022-01-14 NOTE — Progress Notes (Signed)
I tried to reach pt several times via phone re: MyRisk results without success.  Letter sent to pt re: MyRisk results and mgmt. No additional screening recommendations.   January 14, 2022     Tracy L Grimes [address] [place] [address]     Dear Ms. Sidle,   I have tried to reach you several times regarding your cancer genetic testing results from July 2023 and am enclosing a hard copy for your records. The test results show you are negative for 48 genes related to breast, ovarian and colon cancers, which is great news! This doesn't mean you will never get any of these cancers, but you don't have a genetic mutation that significantly increases your risk of these cancers. The results also contain two computer models that calculated your lifetime risk of breast cancer as 10.1% and 12.1%. In comparison, the lifetime risk of breast cancer for other women your age in the general population is 10.3%, so there are not any extra screening recommendations for you at this time.   You have access to a genetic counselor at [facility] (at no charge to you) if you would like to discuss this further (phone number circled on lab results) or you can call me at the office.    Please let me know if you have any further questions after reviewing your results.   Sincerely,       Ardeth Perfect, PA-C

## 2022-06-06 DIAGNOSIS — N951 Menopausal and female climacteric states: Secondary | ICD-10-CM | POA: Diagnosis not present

## 2022-06-06 DIAGNOSIS — M25562 Pain in left knee: Secondary | ICD-10-CM | POA: Diagnosis not present

## 2022-12-03 NOTE — Progress Notes (Unsigned)
PCP: Cyndie Mull, DO   No chief complaint on file.   HPI:      [name] is a 57 y.o. [phone] who LMP was No LMP recorded. Patient has had a hysterectomy., presents today for her annual examination.  Her menses are absent due to lap supracervical hyst for leio, still has ovaries. She does not have PMB.  She did have vasomotor sx, as well as emotional sx, anxiety/depression, insomnia, memory loss, moodiness, and vaginal dryness last yr, improved with HRT. Would like to continue Rx.   Sex activity: single partner, contraception - status post hysterectomy. No pain/bleeding.   Last Pap: 12/06/18 Results were: no abnormalities /neg HPV DNA.  Hx of STDs: none  Last mammogram: 09/2021 at [facility] per pt (I can't see results in [facility]). Had neg breast bx due to LT breast calcification in past.  There is a FH of breast and colon cancer in her mat aunt, genetic testing not done There is no FH of ovarian cancer. The patient  self-breast exams. Ot is [facility] ***  Colonoscopy: 03/2017 at [facility];  Repeat due after 10 years.   Tobacco use: The patient denies current or previous tobacco use. Alcohol use: social drinker No drug use Exercise: not active  She does get adequate calcium but not Vitamin D in her diet. Would like labs for CMP and CBC; hx of anemia in past but now postmenopausal. Pt has been having LBP and wonders about kidneys. 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   Pt with increased fatigue. Sleeps about 6 hrs nightly, not exercising, not drinking enough water. Does snore per her husband.    Past Medical History:  Diagnosis Date   Anemia    BRCA negative 11/2021   MyRisk neg except MSH3 VUS; IBIS=10.1%/riskscore=12.1%   Breast calcification, left 2018   Family history of breast cancer    H/O varicella     Past Surgical History:  Procedure Laterality Date   APPENDECTOMY     BREAST BIOPSY Left 2018   microcalcifications; neg.    COLON SURGERY      to decrease length of colon   COLONOSCOPY  2018   repeat after 10 yrs, done at [facility]   LAPAROSCOPIC SUPRACERVICAL HYSTERECTOMY  2007   leio   WISDOM TOOTH EXTRACTION      Family History  Problem Relation Age of Onset   Heart disease Paternal Uncle    Heart disease Paternal Uncle    Heart disease Paternal Uncle    Heart disease Paternal Uncle    Cancer Maternal Aunt        COLON   Breast cancer Maternal Aunt 50   Cancer Maternal Uncle        COLON   Hypertension Mother     Social History   Socioeconomic History   Marital status: Married    Spouse name: Not on file   Number of children: Not on file   Years of education: Not on file   Highest education level: Not on file  Occupational History   Not on file  Tobacco Use   Smoking status: Never   Smokeless tobacco: Never  Vaping Use   Vaping status: Never Used  Substance and Sexual Activity   Alcohol use: Yes    Comment: OCCASIONAL   Drug use: No   Sexual activity: Yes    Birth control/protection: Surgical    Comment: BTL AND PT HAD HYST  Other Topics Concern   Not on  file  Social History Narrative   Not on file   Social Determinants of Health   Financial Resource Strain: Not on file  Food Insecurity: Not on file  Transportation Needs: Not on file  Physical Activity: Not on file  Stress: Not on file  Social Connections: Not on file  Intimate Partner Violence: Not on file    Outpatient Medications Prior to Visit  Medication Sig Dispense Refill   estradiol-norethindrone (MIMVEY) 1-0.5 MG tablet Take 1 tablet by mouth daily. 90 tablet 3   No facility-administered medications prior to visit.       ROS:  Review of Systems  Constitutional:  Negative for fatigue, fever and unexpected weight change.  Respiratory:  Negative for cough, shortness of breath and wheezing.   Cardiovascular:  Negative for chest pain, palpitations and leg swelling.  Gastrointestinal:  Negative for blood in stool, constipation,  diarrhea, nausea and vomiting.  Endocrine: Negative for cold intolerance, heat intolerance and polyuria.  Genitourinary:  Negative for dyspareunia, dysuria, flank pain, frequency, genital sores, hematuria, menstrual problem, pelvic pain, urgency, vaginal bleeding, vaginal discharge and vaginal pain.  Musculoskeletal:  Negative for back pain, joint swelling and myalgias.  Skin:  Negative for rash.  Neurological:  Negative for dizziness, syncope, light-headedness, numbness and headaches.  Hematological:  Negative for adenopathy.  Psychiatric/Behavioral:  Negative for agitation, confusion, sleep disturbance and suicidal ideas. The patient is not nervous/anxious.   BREAST: No symptoms    Objective: There were no vitals taken for this visit.   Physical Exam Constitutional:      Appearance: She is well-developed.  Genitourinary:     Vulva normal.     Genitourinary Comments: UTERUS SURG REM     No vaginal discharge, erythema or tenderness.      Right Adnexa: not tender and no mass present.    Left Adnexa: not tender and no mass present.    Uterus is absent.  Breasts:    Right: No mass, nipple discharge, skin change or tenderness.     Left: No mass, nipple discharge, skin change or tenderness.  Neck:     Thyroid: No thyromegaly.  Cardiovascular:     Rate and Rhythm: Normal rate and regular rhythm.     Heart sounds: Normal heart sounds. No murmur heard. Pulmonary:     Effort: Pulmonary effort is normal.     Breath sounds: Normal breath sounds.  Abdominal:     Palpations: Abdomen is soft.     Tenderness: There is no abdominal tenderness. There is no guarding.  Musculoskeletal:        General: Normal range of motion.     Cervical back: Normal range of motion.  Neurological:     General: No focal deficit present.     Mental Status: She is alert and oriented to person, place, and time.     Cranial Nerves: No cranial nerve deficit.  Skin:    General: Skin is warm and dry.   Psychiatric:        Mood and Affect: Mood normal.        Behavior: Behavior normal.        Thought Content: Thought content normal.        Judgment: Judgment normal.  Vitals reviewed.     Assessment/Plan:  Encounter for annual routine gynecological examination  Encounter for screening mammogram for malignant neoplasm of breast - Plan: MM 3D SCREEN BREAST BILATERAL; pt to schedule mammo  Vasomotor symptoms due to menopause - Plan: estradiol-norethindrone (MIMVEY)  1-0.5 MG tablet; Rx RF.   Hormone replacement therapy (HRT) - Plan: estradiol-norethindrone (MIMVEY) 1-0.5 MG tablet  Family history of breast cancer - Plan: Integrated [facility]); MyRisk testing discussed and done today. Will f/u with results.   Blood tests for routine general physical examination - Plan: Comprehensive metabolic panel, CBC with Differential/Platelet  Screening for deficiency anemia - Plan: CBC with Differential/Platelet (pt request)  Fatigue--increase sleep, water, exercise, check labs. If sx persist and labs normal, will do sleep study.   No orders of the defined types were placed in this encounter.           GYN counsel breast self exam, mammography screening, menopause, adequate intake of calcium and vitamin D, diet and exercise    F/U  No follow-ups on file.  Taheem Fricke B. Nova Schmuhl, PA-C 12/03/2022 8:08 PM

## 2022-12-04 ENCOUNTER — Encounter: Payer: Self-pay | Admitting: Obstetrics and Gynecology

## 2022-12-04 ENCOUNTER — Ambulatory Visit (INDEPENDENT_AMBULATORY_CARE_PROVIDER_SITE_OTHER): Payer: Self-pay | Admitting: Obstetrics and Gynecology

## 2022-12-04 ENCOUNTER — Other Ambulatory Visit (HOSPITAL_COMMUNITY): Admission: RE | Admit: 2022-12-04 | Payer: BC Managed Care – PPO | Source: Ambulatory Visit

## 2022-12-04 VITALS — BP 106/68 | HR 59 | Ht 62.5 in | Wt 172.0 lb

## 2022-12-04 DIAGNOSIS — Z124 Encounter for screening for malignant neoplasm of cervix: Secondary | ICD-10-CM

## 2022-12-04 DIAGNOSIS — Z803 Family history of malignant neoplasm of breast: Secondary | ICD-10-CM

## 2022-12-04 DIAGNOSIS — Z01419 Encounter for gynecological examination (general) (routine) without abnormal findings: Secondary | ICD-10-CM

## 2022-12-04 DIAGNOSIS — Z1231 Encounter for screening mammogram for malignant neoplasm of breast: Secondary | ICD-10-CM

## 2022-12-04 DIAGNOSIS — Z1151 Encounter for screening for human papillomavirus (HPV): Secondary | ICD-10-CM

## 2022-12-04 DIAGNOSIS — N951 Menopausal and female climacteric states: Secondary | ICD-10-CM

## 2022-12-04 DIAGNOSIS — Z79890 Hormone replacement therapy (postmenopausal): Secondary | ICD-10-CM

## 2022-12-04 MED ORDER — ESTRADIOL-NORETHINDRONE ACET 1-0.5 MG PO TABS: 1.0000 | ORAL_TABLET | Freq: Every day | ORAL | 3 refills | Status: AC

## 2022-12-04 NOTE — Patient Instructions (Addendum)
I value your feedback and you entrusting us with your care. If you get a [facility] patient survey, I would appreciate you taking the time to let us know about your experience today. Thank you!  [facility] ([place])--[phone]  

## 2022-12-08 LAB — CYTOLOGY - PAP
Comment: NEGATIVE
Diagnosis: NEGATIVE
High risk HPV: NEGATIVE

## 2022-12-25 ENCOUNTER — Ambulatory Visit
Admission: RE | Admit: 2022-12-25 | Discharge: 2022-12-25 | Disposition: A | Discharge status: Home / Self Care | Payer: BC Managed Care – PPO | Source: Ambulatory Visit | Attending: Obstetrics and Gynecology | Admitting: Obstetrics and Gynecology

## 2022-12-25 DIAGNOSIS — Z1231 Encounter for screening mammogram for malignant neoplasm of breast: Secondary | ICD-10-CM | POA: Diagnosis present

## 2022-12-25 DIAGNOSIS — Z803 Family history of malignant neoplasm of breast: Secondary | ICD-10-CM | POA: Diagnosis present

## 2022-12-29 NOTE — Group Note (Deleted)

## 2023-02-05 ENCOUNTER — Encounter: Payer: Self-pay | Admitting: Obstetrics and Gynecology

## 2023-02-05 MED ORDER — FLUCONAZOLE 150 MG PO TABS
150.0000 mg | ORAL_TABLET | Freq: Once | ORAL | 0 refills | Status: AC
Start: 2023-02-05 — End: 2023-02-05

## 2023-03-11 ENCOUNTER — Other Ambulatory Visit: Payer: Self-pay | Admitting: Internal Medicine

## 2023-03-11 DIAGNOSIS — M778 Other enthesopathies, not elsewhere classified: Secondary | ICD-10-CM

## 2023-03-13 ENCOUNTER — Ambulatory Visit
Admission: RE | Admit: 2023-03-13 | Discharge: 2023-03-13 | Disposition: A | Discharge status: Home / Self Care | Payer: BC Managed Care – PPO | Source: Ambulatory Visit | Attending: Internal Medicine | Admitting: Internal Medicine

## 2023-03-13 DIAGNOSIS — M778 Other enthesopathies, not elsewhere classified: Secondary | ICD-10-CM | POA: Insufficient documentation

## 2023-03-18 ENCOUNTER — Encounter: Payer: Self-pay | Admitting: Obstetrics and Gynecology

## 2023-03-18 DIAGNOSIS — N898 Other specified noninflammatory disorders of vagina: Secondary | ICD-10-CM

## 2023-03-18 MED ORDER — FLUCONAZOLE 150 MG PO TABS
150.0000 mg | ORAL_TABLET | Freq: Once | ORAL | 0 refills | Status: AC
Start: 2023-03-18 — End: 2023-03-18

## 2023-10-27 ENCOUNTER — Encounter: Payer: Self-pay | Admitting: Obstetrics and Gynecology

## 2024-02-11 ENCOUNTER — Other Ambulatory Visit: Payer: Self-pay | Admitting: Obstetrics and Gynecology

## 2024-02-11 DIAGNOSIS — N951 Menopausal and female climacteric states: Secondary | ICD-10-CM

## 2024-02-11 DIAGNOSIS — Z79890 Hormone replacement therapy (postmenopausal): Secondary | ICD-10-CM
